# Patient Record
Sex: Female | Born: 1955 | Race: Black or African American | Hispanic: No | Smoking: Never smoker
Health system: Southern US, Community
[De-identification: ages and names within clinical notes are randomized; demographics above are authoritative.]

## PROBLEM LIST (undated history)

## (undated) DIAGNOSIS — E559 Vitamin D deficiency, unspecified: Secondary | ICD-10-CM

## (undated) DIAGNOSIS — I10 Essential (primary) hypertension: Secondary | ICD-10-CM

## (undated) DIAGNOSIS — I639 Cerebral infarction, unspecified: Secondary | ICD-10-CM

## (undated) DIAGNOSIS — E7800 Pure hypercholesterolemia, unspecified: Secondary | ICD-10-CM

## (undated) DIAGNOSIS — R569 Unspecified convulsions: Secondary | ICD-10-CM

---

## 2018-07-22 DIAGNOSIS — Z8673 Personal history of transient ischemic attack (TIA), and cerebral infarction without residual deficits: Secondary | ICD-10-CM | POA: Insufficient documentation

## 2018-07-22 DIAGNOSIS — I10 Essential (primary) hypertension: Secondary | ICD-10-CM | POA: Insufficient documentation

## 2018-07-22 DIAGNOSIS — E7849 Other hyperlipidemia: Secondary | ICD-10-CM | POA: Insufficient documentation

## 2018-07-22 DIAGNOSIS — F329 Major depressive disorder, single episode, unspecified: Secondary | ICD-10-CM | POA: Insufficient documentation

## 2019-04-20 ENCOUNTER — Emergency Department (HOSPITAL_BASED_OUTPATIENT_CLINIC_OR_DEPARTMENT_OTHER): Payer: No Typology Code available for payment source

## 2019-04-20 ENCOUNTER — Emergency Department (HOSPITAL_BASED_OUTPATIENT_CLINIC_OR_DEPARTMENT_OTHER)
Admission: EM | Admit: 2019-04-20 | Discharge: 2019-04-20 | Disposition: A | Discharge status: Home / Self Care | Payer: No Typology Code available for payment source | Attending: Emergency Medicine | Admitting: Emergency Medicine

## 2019-04-20 ENCOUNTER — Other Ambulatory Visit: Payer: Self-pay

## 2019-04-20 ENCOUNTER — Encounter (HOSPITAL_BASED_OUTPATIENT_CLINIC_OR_DEPARTMENT_OTHER): Payer: Self-pay | Admitting: Emergency Medicine

## 2019-04-20 DIAGNOSIS — I10 Essential (primary) hypertension: Secondary | ICD-10-CM | POA: Insufficient documentation

## 2019-04-20 DIAGNOSIS — Z20822 Contact with and (suspected) exposure to covid-19: Secondary | ICD-10-CM | POA: Diagnosis not present

## 2019-04-20 DIAGNOSIS — R569 Unspecified convulsions: Secondary | ICD-10-CM | POA: Insufficient documentation

## 2019-04-20 HISTORY — DX: Essential (primary) hypertension: I10

## 2019-04-20 HISTORY — DX: Pure hypercholesterolemia, unspecified: E78.00

## 2019-04-20 HISTORY — DX: Unspecified convulsions: R56.9

## 2019-04-20 HISTORY — DX: Vitamin D deficiency, unspecified: E55.9

## 2019-04-20 HISTORY — DX: Cerebral infarction, unspecified: I63.9

## 2019-04-20 LAB — URINALYSIS, ROUTINE W REFLEX MICROSCOPIC
Bilirubin Urine: NEGATIVE
Glucose, UA: NEGATIVE mg/dL
Hgb urine dipstick: NEGATIVE
Ketones, ur: NEGATIVE mg/dL
Leukocytes,Ua: NEGATIVE
Nitrite: NEGATIVE
Protein, ur: NEGATIVE mg/dL
Specific Gravity, Urine: 1.02 (ref 1.005–1.030)
pH: 7.5 (ref 5.0–8.0)

## 2019-04-20 LAB — CBC WITH DIFFERENTIAL/PLATELET
Abs Immature Granulocytes: 0.01 10*3/uL (ref 0.00–0.07)
Basophils Absolute: 0 10*3/uL (ref 0.0–0.1)
Basophils Relative: 0 %
Eosinophils Absolute: 0 10*3/uL (ref 0.0–0.5)
Eosinophils Relative: 0 %
HCT: 38.4 % (ref 36.0–46.0)
Hemoglobin: 12.6 g/dL (ref 12.0–15.0)
Immature Granulocytes: 0 %
Lymphocytes Relative: 15 %
Lymphs Abs: 0.7 10*3/uL (ref 0.7–4.0)
MCH: 30.1 pg (ref 26.0–34.0)
MCHC: 32.8 g/dL (ref 30.0–36.0)
MCV: 91.6 fL (ref 80.0–100.0)
Monocytes Absolute: 0.4 10*3/uL (ref 0.1–1.0)
Monocytes Relative: 9 %
Neutro Abs: 3.6 10*3/uL (ref 1.7–7.7)
Neutrophils Relative %: 76 %
Platelets: 292 10*3/uL (ref 150–400)
RBC: 4.19 MIL/uL (ref 3.87–5.11)
RDW: 13.6 % (ref 11.5–15.5)
WBC: 4.8 10*3/uL (ref 4.0–10.5)
nRBC: 0 % (ref 0.0–0.2)

## 2019-04-20 LAB — COMPREHENSIVE METABOLIC PANEL
ALT: 27 U/L (ref 0–44)
AST: 23 U/L (ref 15–41)
Albumin: 4.2 g/dL (ref 3.5–5.0)
Alkaline Phosphatase: 118 U/L (ref 38–126)
Anion gap: 7 (ref 5–15)
BUN: 14 mg/dL (ref 8–23)
CO2: 29 mmol/L (ref 22–32)
Calcium: 9.7 mg/dL (ref 8.9–10.3)
Chloride: 99 mmol/L (ref 98–111)
Creatinine, Ser: 0.54 mg/dL (ref 0.44–1.00)
GFR calc Af Amer: >60 mL/min (ref >60)
GFR calc non Af Amer: >60 mL/min (ref >60)
Glucose, Bld: 85 mg/dL (ref 70–99)
Potassium: 3.7 mmol/L (ref 3.5–5.1)
Sodium: 135 mmol/L (ref 135–145)
Total Bilirubin: 0.5 mg/dL (ref 0.3–1.2)
Total Protein: 8.6 g/dL — ABNORMAL HIGH (ref 6.5–8.1)

## 2019-04-20 LAB — SARS CORONAVIRUS 2 (TAT 6-24 HRS): SARS Coronavirus 2: NEGATIVE

## 2019-04-20 LAB — PHENYTOIN LEVEL, TOTAL: Phenytoin Lvl: <2.5 ug/mL — ABNORMAL LOW (ref 10.0–20.0)

## 2019-04-20 LAB — LACTIC ACID, PLASMA: Lactic Acid, Venous: 1.2 mmol/L (ref 0.5–1.9)

## 2019-04-20 MED ORDER — PHENYTOIN SODIUM EXTENDED 100 MG PO CAPS
300.0000 mg | ORAL_CAPSULE | Freq: Once | ORAL | Status: AC
  Administered 2019-04-20: 300 mg via ORAL
  Filled 2019-04-20: qty 3

## 2019-04-20 NOTE — ED Provider Notes (Signed)
[facility] EMERGENCY DEPARTMENT Provider Note   CSN: [id number] Arrival date & time: 04/20/19  1055     History Chief Complaint  Patient presents with  . Seizures    Casey Edwards is a 64 y.o. female.  HPI   64 year old female with possible seizure.  History is from her sister.  Patient has a history of a stroke and aphasia.  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  She has been on different agents over the years.  Currently she is on Keppra and Dilantin.  The Dilantin was recently decreased in dosage a few weeks ago.  This morning the patient had an episode which per she was just staring blankly and not responding.  No oral trauma.  No shaking.  No incontinence.  Symptoms lasted a few minutes and then resolved.  Happened again shortly later so she brought her in for evaluation.  Otherwise seems to be acting like her normal self.  No reported fevers.  No vomiting or diarrhea.  Patient has not seem to be in any distress otherwise.  Past Medical History:  Diagnosis Date  . Hypercholesteremia   . Hypertension   . Seizures ([place])   . Stroke ([place])   . Vitamin D deficiency     There are no problems to display for this patient.   History reviewed. No pertinent surgical history.   OB History   No obstetric history on file.     History reviewed. No pertinent family history.  Social History   Tobacco Use  . Smoking status: Never Smoker  . Smokeless tobacco: Never Used  Substance Use Topics  . Alcohol use: Never  . Drug use: Never    Home Medications Prior to Admission medications   Not on File    Allergies    Patient has no known allergies.  Review of Systems   Review of Systems All systems reviewed and negative, other than as noted in HPI.  Physical Exam Updated Vital Signs BP 140/84   Pulse (!) 102   Temp 100.1 F (37.8 C) (Oral)   Resp (!) 22   Ht 5\' 3"  (1.6 m)   Wt 60.8 kg   SpO2 98%   BMI 23.74 kg/m   Physical  Exam Vitals and nursing note reviewed.  Constitutional:      General: She is not in acute distress.    Appearance: She is well-developed.  HENT:     Head: Normocephalic and atraumatic.  Eyes:     General:        Right eye: No discharge.        Left eye: No discharge.     Conjunctiva/sclera: Conjunctivae normal.  Cardiovascular:     Rate and Rhythm: Normal rate and regular rhythm.     Heart sounds: Normal heart sounds. No murmur. No friction rub. No gallop.   Pulmonary:     Effort: Pulmonary effort is normal. No respiratory distress.     Breath sounds: Normal breath sounds.  Abdominal:     General: There is no distension.     Palpations: Abdomen is soft.     Tenderness: There is no abdominal tenderness.  Musculoskeletal:        General: No tenderness.     Cervical back: Neck supple.  Skin:    General: Skin is warm and dry.  Neurological:     Mental Status: She is alert.     Comments: Laying in bed awake.  Will follow commands.  Will nod head yes/no and answer simple questions.  Psychiatric:        Behavior: Behavior normal.        Thought Content: Thought content normal.     ED Results / Procedures / Treatments   Labs (all labs ordered are listed, but only abnormal results are displayed) Labs Reviewed  URINE CULTURE - Abnormal; Notable for the following components:      Result Value   Culture MULTIPLE SPECIES PRESENT, SUGGEST RECOLLECTION (*)    All other components within normal limits  COMPREHENSIVE METABOLIC PANEL - Abnormal; Notable for the following components:   Total Protein 8.6 (*)    All other components within normal limits  URINALYSIS, ROUTINE W REFLEX MICROSCOPIC - Abnormal; Notable for the following components:   APPearance CLOUDY (*)    All other components within normal limits  PHENYTOIN LEVEL, TOTAL - Abnormal; Notable for the following components:   Phenytoin Lvl <2.5 (*)    All other components within normal limits  SARS CORONAVIRUS 2 (TAT 6-24  HRS)  CBC WITH DIFFERENTIAL/PLATELET  LACTIC ACID, PLASMA    EKG None  Radiology DG Chest Portable 1 View  Result Date: 04/20/2019 CLINICAL DATA:  Fever. EXAM: PORTABLE CHEST 1 VIEW COMPARISON:  None. FINDINGS: Patient rotated minimally left. Midline trachea. Normal heart size. Atherosclerosis in the transverse aorta. No pleural effusion or pneumothorax. Low lung volumes. Clear lungs. IMPRESSION: Low lung volumes, without acute disease. Aortic Atherosclerosis (ICD10-I70.0). Electronically Signed   By: Jeronimo Greaves M.D.   On: 04/20/2019 12:00    Procedures Procedures (including critical care time)  Medications Ordered in ED Medications - No data to display  ED Course  I have reviewed the triage vital signs and the nursing notes.  Pertinent labs & imaging results that were available during my care of the patient were reviewed by me and considered in my medical decision making (see chart for details).    MDM Rules/Calculators/A&P                     64 year old female with seizure-like activity.  Now back to baseline.  Dilantin level is low.  Loaded.  We will have her increase dose to 200 mg every other day with 100 mg on the off day.  Discussed with her family who is in charge of her medications.  They are comfortable with this dosing.  Outpatient follow-up with her typical providers otherwise. Final Clinical Impression(s) / ED Diagnoses Final diagnoses:  Seizure-like activity [facility])    Rx / DC Orders ED Discharge Orders    None       Raeford Razor, MD 04/23/19 1125

## 2019-04-20 NOTE — ED Notes (Signed)
Assisted pt to use bedpan to void; sample unable to be used d/t decontamination.

## 2019-04-20 NOTE — ED Triage Notes (Signed)
Pt having some seizure like symptoms around the beginning of Feb.  Shaking, not talking well.  [facility], found that her phenobarb level was too high.  They decreased dose to one daily.  Today mother went to wake her up and she did not respond to her, seemed to be staring.  Did not seem like herself.  Continues to have staring episodes, mother believes they may be seizures.

## 2019-04-20 NOTE — Discharge Instructions (Addendum)
Alternate days taking: 1) 1 capsule 100mg  of dilantin and 2) 2 capsules of 100mg  dilantin  Follow-up with her doctor at the .

## 2019-04-21 LAB — URINE CULTURE

## 2020-08-24 ENCOUNTER — Other Ambulatory Visit: Payer: Self-pay

## 2020-08-24 ENCOUNTER — Emergency Department (HOSPITAL_COMMUNITY): Payer: No Typology Code available for payment source

## 2020-08-24 ENCOUNTER — Inpatient Hospital Stay (HOSPITAL_COMMUNITY)
Admission: EM | Admit: 2020-08-24 | Discharge: 2020-08-30 | DRG: 101 | Disposition: A | Discharge status: Skilled Nursing Facility | Payer: No Typology Code available for payment source | Source: Skilled Nursing Facility | Attending: Student in an Organized Health Care Education/Training Program | Admitting: Student in an Organized Health Care Education/Training Program

## 2020-08-24 ENCOUNTER — Observation Stay (HOSPITAL_COMMUNITY): Payer: No Typology Code available for payment source

## 2020-08-24 DIAGNOSIS — Z7902 Long term (current) use of antithrombotics/antiplatelets: Secondary | ICD-10-CM

## 2020-08-24 DIAGNOSIS — N390 Urinary tract infection, site not specified: Secondary | ICD-10-CM | POA: Diagnosis not present

## 2020-08-24 DIAGNOSIS — G40909 Epilepsy, unspecified, not intractable, without status epilepticus: Secondary | ICD-10-CM | POA: Diagnosis not present

## 2020-08-24 DIAGNOSIS — M170 Bilateral primary osteoarthritis of knee: Secondary | ICD-10-CM | POA: Diagnosis present

## 2020-08-24 DIAGNOSIS — R569 Unspecified convulsions: Secondary | ICD-10-CM

## 2020-08-24 DIAGNOSIS — F0150 Vascular dementia without behavioral disturbance: Secondary | ICD-10-CM | POA: Diagnosis present

## 2020-08-24 DIAGNOSIS — G9340 Encephalopathy, unspecified: Secondary | ICD-10-CM | POA: Diagnosis present

## 2020-08-24 DIAGNOSIS — F32A Depression, unspecified: Secondary | ICD-10-CM | POA: Diagnosis present

## 2020-08-24 DIAGNOSIS — F0390 Unspecified dementia without behavioral disturbance: Secondary | ICD-10-CM | POA: Diagnosis present

## 2020-08-24 DIAGNOSIS — R4182 Altered mental status, unspecified: Secondary | ICD-10-CM | POA: Diagnosis not present

## 2020-08-24 DIAGNOSIS — I10 Essential (primary) hypertension: Secondary | ICD-10-CM | POA: Diagnosis present

## 2020-08-24 DIAGNOSIS — E7800 Pure hypercholesterolemia, unspecified: Secondary | ICD-10-CM | POA: Diagnosis present

## 2020-08-24 DIAGNOSIS — G8191 Hemiplegia, unspecified affecting right dominant side: Secondary | ICD-10-CM

## 2020-08-24 DIAGNOSIS — Z79899 Other long term (current) drug therapy: Secondary | ICD-10-CM

## 2020-08-24 DIAGNOSIS — E559 Vitamin D deficiency, unspecified: Secondary | ICD-10-CM | POA: Diagnosis present

## 2020-08-24 DIAGNOSIS — Z20822 Contact with and (suspected) exposure to covid-19: Secondary | ICD-10-CM | POA: Diagnosis present

## 2020-08-24 DIAGNOSIS — I69320 Aphasia following cerebral infarction: Secondary | ICD-10-CM

## 2020-08-24 DIAGNOSIS — I639 Cerebral infarction, unspecified: Secondary | ICD-10-CM | POA: Diagnosis present

## 2020-08-24 DIAGNOSIS — R339 Retention of urine, unspecified: Secondary | ICD-10-CM

## 2020-08-24 DIAGNOSIS — I69351 Hemiplegia and hemiparesis following cerebral infarction affecting right dominant side: Secondary | ICD-10-CM

## 2020-08-24 LAB — DIFFERENTIAL
Abs Immature Granulocytes: 0.06 10*3/uL (ref 0.00–0.07)
Basophils Absolute: 0 10*3/uL (ref 0.0–0.1)
Basophils Relative: 0 %
Eosinophils Absolute: 0 10*3/uL (ref 0.0–0.5)
Eosinophils Relative: 0 %
Immature Granulocytes: 1 %
Lymphocytes Relative: 9 %
Lymphs Abs: 1.2 10*3/uL (ref 0.7–4.0)
Monocytes Absolute: 1.2 10*3/uL — ABNORMAL HIGH (ref 0.1–1.0)
Monocytes Relative: 9 %
Neutro Abs: 10.8 10*3/uL — ABNORMAL HIGH (ref 1.7–7.7)
Neutrophils Relative %: 81 %

## 2020-08-24 LAB — POCT I-STAT, CHEM 8
BUN: 14 mg/dL (ref 8–23)
Calcium, Ion: 1.07 mmol/L — ABNORMAL LOW (ref 1.15–1.40)
Chloride: 105 mmol/L (ref 98–111)
Creatinine, Ser: 0.3 mg/dL — ABNORMAL LOW (ref 0.44–1.00)
Glucose, Bld: 109 mg/dL — ABNORMAL HIGH (ref 70–99)
HCT: 44 % (ref 36.0–46.0)
Hemoglobin: 15 g/dL (ref 12.0–15.0)
Potassium: 4.1 mmol/L (ref 3.5–5.1)
Sodium: 138 mmol/L (ref 135–145)
TCO2: 24 mmol/L (ref 22–32)

## 2020-08-24 LAB — RESP PANEL BY RT-PCR (FLU A&B, COVID) ARPGX2
Influenza A by PCR: NEGATIVE
Influenza B by PCR: NEGATIVE
SARS Coronavirus 2 by RT PCR: NEGATIVE

## 2020-08-24 LAB — COMPREHENSIVE METABOLIC PANEL
ALT: 38 U/L (ref 0–44)
AST: 47 U/L — ABNORMAL HIGH (ref 15–41)
Albumin: 3.9 g/dL (ref 3.5–5.0)
Alkaline Phosphatase: 106 U/L (ref 38–126)
Anion gap: 13 (ref 5–15)
BUN: 13 mg/dL (ref 8–23)
CO2: 23 mmol/L (ref 22–32)
Calcium: 9.9 mg/dL (ref 8.9–10.3)
Chloride: 103 mmol/L (ref 98–111)
Creatinine, Ser: 0.54 mg/dL (ref 0.44–1.00)
GFR, Estimated: >60 mL/min (ref >60)
Glucose, Bld: 105 mg/dL — ABNORMAL HIGH (ref 70–99)
Potassium: 4.5 mmol/L (ref 3.5–5.1)
Sodium: 139 mmol/L (ref 135–145)
Total Bilirubin: 0.4 mg/dL (ref 0.3–1.2)
Total Protein: 7.8 g/dL (ref 6.5–8.1)

## 2020-08-24 LAB — CBC
HCT: 39.6 % (ref 36.0–46.0)
Hemoglobin: 13.1 g/dL (ref 12.0–15.0)
MCH: 29.4 pg (ref 26.0–34.0)
MCHC: 33.1 g/dL (ref 30.0–36.0)
MCV: 89 fL (ref 80.0–100.0)
Platelets: 275 10*3/uL (ref 150–400)
RBC: 4.45 MIL/uL (ref 3.87–5.11)
RDW: 14.6 % (ref 11.5–15.5)
WBC: 13.3 10*3/uL — ABNORMAL HIGH (ref 4.0–10.5)
nRBC: 0 % (ref 0.0–0.2)

## 2020-08-24 LAB — PROTIME-INR
INR: 1.1 (ref 0.8–1.2)
Prothrombin Time: 14.3 seconds (ref 11.4–15.2)

## 2020-08-24 LAB — APTT: aPTT: 22 seconds — ABNORMAL LOW (ref 24–36)

## 2020-08-24 LAB — CBG MONITORING, ED: Glucose-Capillary: 102 mg/dL — ABNORMAL HIGH (ref 70–99)

## 2020-08-24 LAB — ETHANOL: Alcohol, Ethyl (B): <10 mg/dL (ref <10)

## 2020-08-24 MED ORDER — POLYETHYLENE GLYCOL 3350 17 G PO PACK: 17.0000 g | PACK | Freq: Every day | ORAL | Status: DC | PRN

## 2020-08-24 MED ORDER — LORAZEPAM 2 MG/ML IJ SOLN: 1.0000 mg | Freq: Once | INTRAMUSCULAR | Status: DC | PRN

## 2020-08-24 MED ORDER — CLOPIDOGREL BISULFATE 75 MG PO TABS
75.0000 mg | ORAL_TABLET | Freq: Every day | ORAL | Status: DC
  Administered 2020-08-25 – 2020-08-30 (×6): 75 mg via ORAL
  Filled 2020-08-24 (×7): qty 1

## 2020-08-24 MED ORDER — LEVETIRACETAM IN NACL 1500 MG/100ML IV SOLN
1500.0000 mg | Freq: Two times a day (BID) | INTRAVENOUS | Status: DC
  Administered 2020-08-24 – 2020-08-25 (×3): 1500 mg via INTRAVENOUS
  Filled 2020-08-24 (×3): qty 100

## 2020-08-24 MED ORDER — SODIUM CHLORIDE 0.9 % IV SOLN
130.0000 mg | Freq: Two times a day (BID) | INTRAVENOUS | Status: DC
  Administered 2020-08-24 – 2020-08-25 (×2): 130 mg via INTRAVENOUS
  Filled 2020-08-24 (×4): qty 2.6

## 2020-08-24 MED ORDER — LORAZEPAM 2 MG/ML IJ SOLN: 1.0000 mg | INTRAMUSCULAR | Status: DC | PRN

## 2020-08-24 MED ORDER — LEVETIRACETAM IN NACL 1000 MG/100ML IV SOLN: 1000.0000 mg | Freq: Two times a day (BID) | INTRAVENOUS | Status: DC

## 2020-08-24 MED ORDER — LORAZEPAM 2 MG/ML IJ SOLN
2.0000 mg | Freq: Once | INTRAMUSCULAR | Status: AC
  Administered 2020-08-24: 2 mg via INTRAVENOUS
  Filled 2020-08-24: qty 1

## 2020-08-24 MED ORDER — SODIUM CHLORIDE 0.9 % IV SOLN
75.0000 mL/h | INTRAVENOUS | Status: DC
  Administered 2020-08-24: 75 mL/h via INTRAVENOUS

## 2020-08-24 MED ORDER — SERTRALINE HCL 50 MG PO TABS
50.0000 mg | ORAL_TABLET | Freq: Every day | ORAL | Status: DC
  Filled 2020-08-24: qty 1

## 2020-08-24 MED ORDER — SIMVASTATIN 20 MG PO TABS
20.0000 mg | ORAL_TABLET | Freq: Every day | ORAL | Status: DC
  Administered 2020-08-24: 20 mg via ORAL
  Filled 2020-08-24: qty 1

## 2020-08-24 MED ORDER — ENOXAPARIN SODIUM 40 MG/0.4ML IJ SOSY
40.0000 mg | PREFILLED_SYRINGE | INTRAMUSCULAR | Status: DC
  Administered 2020-08-24 – 2020-08-30 (×7): 40 mg via SUBCUTANEOUS
  Filled 2020-08-24 (×7): qty 0.4

## 2020-08-24 NOTE — Progress Notes (Signed)
STAT EEG completed; results pending. Dr Yadav notified. 

## 2020-08-24 NOTE — Progress Notes (Signed)
Phenytoin Initial Consult Indication: Seizures (continuation of home regimen)  No Known Allergies  Patient Measurements: Height: 5\' 3"  (160 cm) Weight: 60.8 kg (134 lb 0.6 oz) IBW/kg (Calculated) : 52.4 TPN AdjBW (KG): 60.8 Body mass index is 23.74 kg/m.   Vital signs: Temp: 98.6 F (37 C) (06/23 0859) Temp Source: Temporal (06/23 0859) BP: 135/84 (06/23 1500) Pulse Rate: 80 (06/23 1500)  Labs: Lab Results  Component Value Date/Time   Albumin 3.9 08/24/2020 0840   Lab Results  Component Value Date   PHENYTOIN <2.5 (L) 04/20/2019   Estimated Creatinine Clearance: 58 mL/min (A) (by C-G formula based on SCr of 0.3 mg/dL (L)).   Medications:  (Not in a hospital admission)   Assessment: 34 YOF who presented with altered mental status. MD felt the presentation to be most consistent with seizure activity and patient was loaded with IV Keppra and started on LTM EEG. Pharmacy consulted to resume home dilantin therapy.   Discussed case with neurology who agrees that it is okay to resume home dose as IV while awaiting phenytoin level and adjusting accordingly   Corrected phenytoin level (if needed): pending  Seizure activity: F/u EEG  Significant potential drug interactions: Dilantin can decrease simastatin levels but patient is on home doses   Goals of care:  Total phenytoin level: 10-20 mcg/ml Free phenytoin level: 1-2 mcg/ml  Plan:  -Resume home dose of phenytoin 130 mg (~4.3 mg/kg/day) twice daily IV  -F/u phenytoin levels  -Anticipated maintenance dose: -Pharmacy will continue to follow regarding obtaining total phenytoin levels and dose adjustments as indicated.   75, PharmD., BCPS, BCCCP Clinical Pharmacist Please refer to [facility] for unit-specific pharmacist

## 2020-08-24 NOTE — Progress Notes (Signed)
LTM started, using MRI compatible leads, no initial skin breakdown. Event button tested.

## 2020-08-24 NOTE — ED Provider Notes (Signed)
[facility] EMERGENCY DEPARTMENT Provider Note   CSN: [id number] Arrival date & time: 08/24/20  1660  An emergency department physician performed an initial assessment on this suspected stroke patient at [phone].  History Chief Complaint  Patient presents with   Code Stroke    Casey Edwards is a 65 y.o. female.  HPI     65 year old female with a history of hypertension, hyperlipidemia, CVA with aphasia and right-sided weakness, seizures labs, presents as a code stroke with EMS after she had developed a right-sided gaze preference and was no longer able to follow commands or communicate per her baseline.  LNW was around 750AM at [facility].  History is limited as patient with altered mental status and also has baseline aphasia. She presents with lip smacking and gaze deviation to the right.  Possible left sided weakness however noted by neurology to be moving the left side at times.  Following commands.    Past Medical History:  Diagnosis Date   Hypercholesteremia    Hypertension    Seizures (HCC)    Stroke [facility])    Vitamin D deficiency     Patient Active Problem List   Diagnosis Date Noted   CVA (cerebral vascular accident) (HCC) 08/24/2020   Seizure (HCC) 08/24/2020    No past surgical history on file.   OB History   No obstetric history on file.     No family history on file.  Social History   Tobacco Use   Smoking status: Never   Smokeless tobacco: Never  Substance Use Topics   Alcohol use: Never   Drug use: Never    Home Medications Prior to Admission medications   Medication Sig Start Date End Date Taking? Authorizing Provider  acetaminophen (TYLENOL) 500 MG tablet Take 1,000 mg by mouth daily.   Yes [provider]  amLODipine (NORVASC) 5 MG tablet Take 5 mg by mouth daily.   Yes [provider]  baclofen (LIORESAL) 10 MG tablet Take 5 mg by mouth 3 (three) times daily.   Yes [provider]   Cholecalciferol (VITAMIN D3) 50 MCG (2000 UT) TABS Take 2,000 Units by mouth daily.   Yes [provider]  clopidogrel (PLAVIX) 75 MG tablet Take 75 mg by mouth daily.   Yes [provider]  Ensure Plus (ENSURE PLUS) LIQD Take 237 mLs by mouth 2 (two) times daily. Strawberry flavor   Yes [provider]  ferrous sulfate 325 (65 FE) MG tablet Take 325 mg by mouth daily.   Yes [provider]  levETIRAcetam (KEPPRA) 250 MG tablet Take 125 mg by mouth 2 (two) times daily.   Yes [provider]  Multiple Vitamins-Minerals (MULTIVITAMIN WITH MINERALS) tablet Take 1 tablet by mouth daily.   Yes [provider]  Omega-3 Fatty Acids (FISH OIL) 1000 MG CAPS Take 2,000 mg by mouth 2 (two) times daily.   Yes [provider]  phenytoin (DILANTIN) 100 MG ER capsule Take 100 mg by mouth 2 (two) times daily. Take with 30mg  capsule for a total of 130mg  two times daily. 04/29/20  Yes [provider]  phenytoin (DILANTIN) 30 MG ER capsule Take 30 mg by mouth 2 (two) times daily. Take with 100mg  capsule for a total of 130mg  two times daily.   Yes [provider]  sertraline (ZOLOFT) 100 MG tablet Take 50 mg by mouth daily.   Yes [provider]  simvastatin (ZOCOR) 20 MG tablet Take 20 mg by mouth  at bedtime.   Yes [provider]    Allergies    Patient has no known allergies.  Review of Systems   Review of Systems  Unable to perform ROS: Mental status change  Constitutional:  Negative for fever.  Neurological:  Positive for speech difficulty and weakness.   Physical Exam Updated Vital Signs BP 126/73   Pulse 82   Temp 98.6 F (37 C) (Temporal)   Resp 18   Ht 5\' 3"  (1.6 m)   Wt 60.8 kg   SpO2 100%   BMI 23.74 kg/m   Physical Exam Vitals and nursing note reviewed.  Constitutional:      General: She is not in acute distress.    Appearance: Normal appearance. She is not ill-appearing, toxic-appearing  or diaphoretic.  HENT:     Head: Normocephalic and atraumatic.  Eyes:     General: No visual field deficit.    Extraocular Movements: Extraocular movements intact.     Conjunctiva/sclera: Conjunctivae normal.     Pupils: Pupils are equal, round, and reactive to light.  Cardiovascular:     Rate and Rhythm: Normal rate and regular rhythm.     Pulses: Normal pulses.  Pulmonary:     Effort: Pulmonary effort is normal. No respiratory distress.  Musculoskeletal:        General: No swelling, tenderness, deformity or signs of injury.     Cervical back: Normal range of motion. No rigidity.  Skin:    General: Skin is warm and dry.     Coloration: Skin is not jaundiced or pale.     Findings: No erythema or rash.  Neurological:     General: No focal deficit present.     Mental Status: She is alert and oriented to person, place, and time.     GCS: GCS eye subscore is 4. GCS verbal subscore is 5. GCS motor subscore is 6.     Cranial Nerves: No cranial nerve deficit (eyes deviated to right), dysarthria or facial asymmetry.     Sensory: Sensory deficit: difficult to assess.     Motor: Weakness (weakness noted RLE and RUE, normal lifting of left side with some weakness, left leg drops when lifted) present. No tremor.     Coordination: Finger-Nose-Finger Test normal.     Gait: Gait normal.     Comments: Lip smacking    ED Results / Procedures / Treatments   Labs (all labs ordered are listed, but only abnormal results are displayed) Labs Reviewed  APTT - Abnormal; Notable for the following components:      Result Value   aPTT 22 (*)    All other components within normal limits  CBC - Abnormal; Notable for the following components:   WBC 13.3 (*)    All other components within normal limits  DIFFERENTIAL - Abnormal; Notable for the following components:   Neutro Abs 10.8 (*)    Monocytes Absolute 1.2 (*)    All other components within normal limits  COMPREHENSIVE METABOLIC PANEL -  Abnormal; Notable for the following components:   Glucose, Bld 105 (*)    AST 47 (*)    All other components within normal limits  CBG MONITORING, ED - Abnormal; Notable for the following components:   Glucose-Capillary 102 (*)    All other components within normal limits  POCT I-STAT, CHEM 8 - Abnormal; Notable for the following components:   Creatinine, Ser 0.30 (*)    Glucose, Bld 109 (*)    Calcium,  Ion 1.07 (*)    All other components within normal limits  RESP PANEL BY RT-PCR (FLU A&B, COVID) ARPGX2  ETHANOL  PROTIME-INR  RAPID URINE DRUG SCREEN, HOSP PERFORMED  URINALYSIS, ROUTINE W REFLEX MICROSCOPIC  PHENYTOIN LEVEL, FREE AND TOTAL  HIV ANTIBODY (ROUTINE TESTING W REFLEX)  HEMOGLOBIN A1C  LIPID PANEL  CBC  BASIC METABOLIC PANEL  I-STAT CHEM 8, ED    EKG EKG Interpretation  Date/Time:  Thursday August 24 2020 08:53:52 EDT Ventricular Rate:  85 PR Interval:  171 QRS Duration: 83 QT Interval:  384 QTC Calculation: 457 R Axis:   -70 Text Interpretation: Sinus rhythm Left anterior fascicular block RSR' in V1 or V2, right VCD or RVH No significant change since last tracing Confirmed by Alvira MondaySchlossman, Yezenia Fredrick ([id number]) on 08/24/2020 12:06:20 PM  Radiology MR BRAIN WO CONTRAST  Result Date: 08/24/2020 CLINICAL DATA:  Code stroke follow-up EXAM: MRI HEAD WITHOUT CONTRAST TECHNIQUE: Multiplanar, multiecho pulse sequences of the brain and surrounding structures were obtained without intravenous contrast. COMPARISON:  Prior MRI from 2014 is unavailable at time of dictation. FINDINGS: Significant motion artifact is present. Below findings are within this limitation. Brain: There is no acute infarction. Foci of susceptibility hypointensity in the left insular region and right putamen may reflect chronic microhemorrhage or mineralization. There are chronic infarcts of the left temporal, parietal, and occipital lobes. Chronic infarct of the left basal ganglia and adjacent white matter. There  is marked volume loss in the left cerebral hemisphere with ex vacuo dilatation of the left lateral ventricle. Associated wallerian degeneration along the left cerebral peduncle and contralateral right cerebellar volume loss noted. There is no intracranial mass or mass effect. No extra-axial collection. Vascular: Major vessel flow voids at the skull base are preserved. Skull and upper cervical spine: Normal marrow signal is preserved. Sinuses/Orbits: No apparent abnormality. Other: Patchy right mastoid fluid opacification. IMPRESSION: Significantly motion degraded. No acute infarction. Multiple chronic infarcts. Electronically Signed   By: Guadlupe SpanishPraneil  Patel M.D.   On: 08/24/2020 14:50   EEG adult  Result Date: 08/24/2020 Charlsie QuestYadav, Priyanka O, MD     08/24/2020 11:28 AM Patient Name: Casey Edwards MRN: [id number] Epilepsy Attending: Charlsie QuestPriyanka O Yadav Referring Physician/Provider: Lanae BoastStevi Toberman, NP Date: 08/24/2020 Duration: 23.19 mins Patient history: 65 year old female with history of stroke and residual right-sided weakness,aphasia found to be acutely altered with right gaze preference.  EEG to evaluate for seizures. Level of alertness: lethargic AEDs during EEG study: LEV, PHT, Ativan Technical aspects: This EEG study was done with scalp electrodes positioned according to the 10-20 International system of electrode placement. Electrical activity was acquired at a sampling rate of 500Hz  and reviewed with a high frequency filter of 70Hz  and a low frequency filter of 1Hz . EEG data were recorded continuously and digitally stored. Description: EEG showed continuous generalized low amplitude 3-5 theta-delta slowing.  Hyperventilation and photic stimulation were not performed.   Of note, study was technically difficult due to the significant myogenic artifact. ABNORMALITY - Continuous slow, generalized IMPRESSION: This technically difficult study suggestive of moderate to severe diffuse encephalopathy, nonspecific etiology.   No seizures or epileptiform discharges were seen throughout the recording. Charlsie QuestPriyanka O Yadav   CT HEAD CODE STROKE WO CONTRAST  Result Date: 08/24/2020 CLINICAL DATA:  Code stroke.  Aphasia, right gaze EXAM: CT HEAD WITHOUT CONTRAST TECHNIQUE: Contiguous axial images were obtained from the base of the skull through the vertex without intravenous contrast. COMPARISON:  Images from 2014 study are unavailable at time  of dictation FINDINGS: Brain: No acute intracranial hemorrhage, mass effect, or edema. There is no definite acute appearing loss of gray-white differentiation. Chronic infarcts of the left temporal, parietal, and occipital lobes. Chronic infarct of the left basal ganglia and adjacent white matter. Ex vacuo dilatation of the left lateral ventricle. Additional patchy and confluent areas of low-attenuation in the supratentorial white matter are nonspecific but probably reflect chronic microvascular ischemic changes. Prominence of the ventricles and sulci reflects parenchymal volume loss. There is no extra-axial collection. 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 Vascular: No hyperdense vessel. There is intracranial atherosclerotic calcification at the skull base. Skull: Unremarkable. Sinuses/Orbits: No acute abnormality. Other: Mastoid air cells are clear. IMPRESSION: No acute intracranial hemorrhage or mass effect. No definite acute infarction. Sequelae of chronic left cerebral infarcts. Chronic microvascular ischemic changes. These results were communicated to Dr. Derry Lory at 8:45 am on 08/24/2020 by text page via the [facility] messaging system. Electronically Signed   By: Guadlupe Spanish M.D.   On: 08/24/2020 08:47    Procedures Procedures   Medications Ordered in ED Medications  levETIRAcetam (KEPPRA) IVPB 1500 mg/ 100 mL premix (1,500 mg Intravenous New Bag/Given 08/24/20 2125)  simvastatin (ZOCOR) tablet 20  mg (20 mg Oral Given 08/24/20 2149)  clopidogrel (PLAVIX) tablet 75 mg (has no administration in time range)  0.9 %  sodium chloride infusion (75 mL/hr Intravenous New Bag/Given 08/24/20 1513)  enoxaparin (LOVENOX) injection 40 mg (40 mg Subcutaneous Given 08/24/20 1513)  LORazepam (ATIVAN) injection 1-2 mg (has no administration in time range)  polyethylene glycol (MIRALAX / GLYCOLAX) packet 17 g (has no administration in time range)  LORazepam (ATIVAN) injection 1 mg (has no administration in time range)  phenytoin (DILANTIN) 130 mg in sodium chloride 0.9 % 100 mL IVPB (0 mg Intravenous Stopped 08/24/20 1811)  LORazepam (ATIVAN) injection 2 mg (2 mg Intravenous Given 08/24/20 0947)    ED Course  I have reviewed the triage vital signs and the nursing notes.  Pertinent labs & imaging results that were available during my care of the patient were reviewed by me and considered in my medical decision making (see chart for details).    MDM Rules/Calculators/A&P                            65 year old female with a history of hypertension, hyperlipidemia, CVA with aphasia and right-sided weakness, seizures labs, presents as a code stroke with EMS after she had developed a right-sided gaze preference and was no longer able to follow commands or communicate per her baseline.  DDx includes seizure versus acute CVA vs other. No significant electrolyte abnormalities. CT without acute findings. Dr. Derry Lory Neurology at bedside on patient arrival, noting some movement of left side, feels presentation is most consistent with seizure and is not recommending tPA or IR. Given keppra.  Admitted for further care, MRI pending.   Final Clinical Impression(s) / ED Diagnoses Final diagnoses:  Seizures [facility])    Rx / DC Orders ED Discharge Orders     None        Alvira Monday, MD 08/24/20 2306

## 2020-08-24 NOTE — Progress Notes (Signed)
Reconnected MRI leads and helped move pt to 038 without incident.

## 2020-08-24 NOTE — H&P (Signed)
Date: 08/24/2020               Patient Name:  Casey Edwards MRN: [id number]  DOB: April 10, 1955 Age / Sex: 65 y.o., female   PCP: [facility]         Medical Service: Internal Medicine Teaching Service         Attending Physician: Dr. Oswaldo Done, Marquita Palms, *    First Contact: Dr. Austin Miles Pager: [phone]  Second Contact: Dr. Huel Cote Pager: [phone]       After Hours (After 5p/  First Contact Pager: [phone]  weekends / holidays): Second Contact Pager: [phone]   Chief Complaint: Left sided weakness  History of Present Illness:  Casey Edwards is a 65 yo F w/ PMH of Cva w/ residual aphasia, right sided weakness, seizure disorder, htn, hld presenting to [facility] with new onset weakness and altered mental status. Casey Edwards was examined and evaluated at bedside in ED. She was noted to be alert but unable to answer questions or follow directions.  Additional history was obtained in conversation with nursing staff at [facility]. She was in her usual state of health until this morning around 7:30 when she was found to be unresponsive and staring preferentially to the right. She also began to have increased respiratory effort in about 20 minutes and EMS was called.   At baseline, she endorse expressive aphasia and is mostly non-verbal although she is able to communicate her needs with hand gestures and nodding. She is able to follow instructions. Noted to have history of seizure disorder thought to be sequelae of prior stroke that presents with 'staring spells' She is confirmed to be on levetiracetam 125mg  BID and phenytoin 130mg  BID for her seizure disorder and nursing staff denies any interruption in med administration.  Attempted to call family to obtain additional history but family member did not pick up. Unable to leave voicemail.  Meds: Current Meds  Medication Sig   acetaminophen (TYLENOL) 500 MG tablet Take 1,000 mg by mouth daily.   amLODipine (NORVASC)  5 MG tablet Take 5 mg by mouth daily.   baclofen (LIORESAL) 10 MG tablet Take 5 mg by mouth 3 (three) times daily.   Cholecalciferol (VITAMIN D3) 50 MCG (2000 UT) TABS Take 2,000 Units by mouth daily.   clopidogrel (PLAVIX) 75 MG tablet Take 75 mg by mouth daily.   Ensure Plus (ENSURE PLUS) LIQD Take 237 mLs by mouth 2 (two) times daily. Strawberry flavor   ferrous sulfate 325 (65 FE) MG tablet Take 325 mg by mouth daily.   levETIRAcetam (KEPPRA) 250 MG tablet Take 125 mg by mouth 2 (two) times daily.   Multiple Vitamins-Minerals (MULTIVITAMIN WITH MINERALS) tablet Take 1 tablet by mouth daily.   Omega-3 Fatty Acids (FISH OIL) 1000 MG CAPS Take 2,000 mg by mouth 2 (two) times daily.   phenytoin (DILANTIN) 100 MG ER capsule Take 100 mg by mouth 2 (two) times daily. Take with 30mg  capsule for a total of 130mg  two times daily.   phenytoin (DILANTIN) 30 MG ER capsule Take 30 mg by mouth 2 (two) times daily. Take with 100mg  capsule for a total of 130mg  two times daily.   sertraline (ZOLOFT) 100 MG tablet Take 50 mg by mouth daily.   simvastatin (ZOCOR) 20 MG tablet Take 20 mg by mouth at bedtime.   Allergies: Allergies as of 08/24/2020   (No Known Allergies)   Past Medical History:  Diagnosis Date   Hypercholesteremia  Hypertension    Seizures (HCC)    Stroke (HCC)    Vitamin D deficiency     Family History: Unable to provide  Social History: Recently moved in to [facility] facility. Needs walker for mobility  Review of Systems: Unable to provide  Physical Exam: Blood pressure 133/77, pulse 78, temperature 98.6 F (37 C), temperature source Temporal, resp. rate 20, height 5\' 3"  (1.6 m), weight 60.8 kg, SpO2 99 %.  Gen: Well-developed, chronically ill-appearing, appear agitated HEENT: NCAT head, hearing intact, no gaze preference noted, lip smacking observed Neck: supple, ROM intact, no JVD CV: RRR, S1, S2 normal, No rubs, no murmurs, no gallops Pulm: CTAB, No rales,  no wheezes Abd: Soft, BS+, NTND, No rebound, no guarding Extm: ROM intact, Peripheral pulses intact Skin: Dry, Warm, normal turgor, no rashes, lesions, wounds.  Neuro: Alert, Aphasic, Unable to follow directions for full neuro exam but observed spontaneous movement of all extremities, left leg unable to hold against gravity  EKG: personally reviewed my interpretation is normal sinus, no ST changes, similar to prior EKG  CXR: N/A  Assessment & Plan by Problem: Principal Problem:   Seizure (HCC) Active Problems:   CVA (cerebral vascular accident) (HCC)  Casey Edwards is a 65 yo F w/ PMH of Cva w/ residual aphasia, right sided weakness, seizure disorder, htn, hld presenting to [facility] with new onset left sided weakness and altered mental status likely 2/2 post-ictal state from breakthrough seizure vs acute CVA  Acute encephalopathy w/ left sided weakness 2/2 Seizure vs Stroke Hx of CVA w/ residual aphasia, Right sided weakness Hx of Seizure disorder Present w/ code stroke due to 'staring spell' CT head negative. Spot EEG w/o seizures or epileptiform findings. Neurology on board. No missed AEDs at SNF per nursing staff. On clopidogrel for anti-platelet therapy. Possibly acute CVA vs breakthrough seizure. Received loading dose levetiracetam. Will need admission for further work-up - Appreciate neuro recs: Continous EEG - Defer anti-epileptic therapy to neuro - Ativan PRN for seizure >5 mins - F/u MRI - F/u Dilantin level - Hgb a1c, lipid profile - PT/OT/SLP - NPO - F/u Ua - C/w home clopidogrel, simvastatin   HTN Admit bp 133/77. On amlodipine 5mg  at home - Holding in setting of cva work-up  Depression On sertraline at home - Holding in setting of encephalopathy  DVT prophx: lovenox Diet: NPO til S&S Bowel: Miralax Code: Full  Prior to Admission Living Arrangement: SNF Anticipated Discharge Location: SNF Barriers to Discharge: Medical work-up  Dispo: Admit patient to  Observation with expected length of stay less than 2 midnights.  Signed: [facility], MD 08/24/2020, 1:27 PM Pager: [phone] After 5pm on weekdays and 1pm on weekends: On Call Pager: [phone]

## 2020-08-24 NOTE — ED Triage Notes (Signed)
Pt BIB [facility] from [facility]. Pt with a last known normal of 0750. Staff became concerned 20 minutes after patient woke up at 0730 because patient is usually able to communicate with staff basic needs, but staff noted she was unable to do this, also was exhibiting left sided weakness and R sided gaze that is new. EMS activated a code stroke based on these findings. Pt does have a hx of stroke in 2020 which left the patient aphasic, with R sided weakness. Pt also has hx of seizures as well. All VSS at this time. NAD at this time. Pt brought to CT 1 for stroke work up.

## 2020-08-24 NOTE — ED Notes (Signed)
Patient transported to MRI. Patient disconnected from EEG for MRI.

## 2020-08-24 NOTE — Consult Note (Signed)
Neurology Consultation  Reason for Consult: Right gaze, altered mental status Referring Physician: Dr. Dalene Seltzer  CC: Right gaze, altered mental status- acutely not following commands  History is obtained from: Chart Review, EMS, unable to obtain history from patient due to altered mental status  HPI: Casey Edwards is a 65 y.o. female with a medical history significant for hypertension, hyperlipidemia, seizures, and large left MCA stroke on daily clopidogrel with residual aphasia and right-sided weakness. This morning at her facility, she woke up at baseline mental and functional status at approximately 07:50 this morning. Staff later found patient with acute right-sided gaze and she was no longer able to follow commands or point to communicate per her baseline and EMS was activated. She was brought in by EMS as a Code Stroke for further neurology evaluation.   At baseline, [name] is aphasic but is able to communicate with nursing facility staff by pointing and she is able to follow commands. She takes phenytoin 130 mg twice daily and levetiracetam 125 mg twice daily for her seizures.    LKW: 07:50 tpa given?: no, CT without acute intracranial abnormalities and it was felt that patient's presentation is more consistent with breakthrough seizures than stroke IR Thrombectomy? No, presentation is felt to be consistent with seizure activity with low suspicion for LVO. Modified Rankin Scale: 3-Moderate disability-requires help but walks WITHOUT assistance  ROS: Unable to obtain due to altered mental status.   Past Medical History:  Diagnosis Date   Hypercholesteremia    Hypertension    Seizures (HCC)    Stroke (HCC)    Vitamin D deficiency    No past surgical history on file.  No family history on file. Unable to obtain from patient due to baseline aphasia  Social History:   reports that she has never smoked. She has never used smokeless tobacco. She reports that she does not  drink alcohol and does not use drugs.  Medications  Current Facility-Administered Medications:    levETIRAcetam (KEPPRA) IVPB 1500 mg/ 100 mL premix, 1,500 mg, Intravenous, Q12H, Micki Riley, MD, Last Rate: 400 mL/hr at 08/24/20 0852, 1,500 mg at 08/24/20 0852 No current outpatient medications on file. No current outpatient medications Levetiracetam 125 mg PO BID Phenytoin 130 mg PO BID Clopidogrel 75 mg PO daily Simvastatin 20 mg qhs   Exam: Current vital signs: BP 124/79   Pulse 87   Temp 98.6 F (37 C) (Temporal)   Resp (!) 23   Ht 5\' 3"  (1.6 m)   Wt 60.8 kg   SpO2 99%   BMI 23.74 kg/m  Vital signs in last 24 hours:   GENERAL: Awake, alert, with abnormal chewing movement noted at her mouth Head: Normocephalic and atraumatic without obvious abnormality. Patient with constant chewing mouth movement on arrival EENT: Normal conjunctivae, no OP obstruction.  LUNGS - Normal respiratory effort. SpO2 99% on room air CV: Regular rate on telemetry, no pedal edema ABDOMEN: Soft, non-distended Ext: warm, well perfused, without obvious deformity  NEURO:  Mental Status: Awake and alert. She is aphasic at baseline but per EMS can point and follow commands. On arrival, she does not follow commands and does not attempt to communicate with staff. Unable to assess orientation due to mental status.  Speech/Language: speech is mute with on episode of moaning noted while in CT.   Cranial Nerves:  II: PERRL 4 mm/brisk. III, IV, VI: Right gaze preference, does not cross midline to the left visual field. V: Unable to assess  facial sensation due to patient mental status. Does not blink to threat in the left visual field. Inconsistent blink to threat in the right visual field.  VII: Face appears symmetric with grimace. VIII: Unable to assess hearing due to patient condition.  IX, X: Patient does not phonate.  XI: She does not follow commands or shrug shoulders. Rightward head turn  preference, resists passive head turn leftward.  XII: Does not protrude tongue to command.  Motor: Baseline right-sided weakness due to previous stroke. Strength assessment limited due to patient not following commands. Spontaneous antigravity movement noted of bilateral upper extremity with vertical drift noted.  Left lower extremity with minimal spontaneous movement without antigravity movement but does withdraw to noxious stimuli with brief antigravity movement.  Right lower extremity does elevate briefly against gravity with immediate drift to bed.  Increased tone noted on the right, intermittent increased tone of the left upper extremity.  Bulk is normal for age.  Sensation: Does not withdraw or grimace to noxious stimuli in the left upper extremity, brief withdraw to noxious stimuli of the left lower extremity. Brisk withdraw to noxious stimuli of the right upper and lower extremities Coordination: Unable to assess, patient does not follow commands DTRs: 2+ biceps, 0 patellae Plantars: Mute on the left, downgoing on the right Gait: Deferred  NIHSS: 1a Level of Conscious.: 0 1b LOC Questions: 2 1c LOC Commands: 2 2 Best Gaze: 1 3 Visual: 2 4 Facial Palsy: 0 5a Motor Arm - left: 1 5b Motor Arm - Right: 1 6a Motor Leg - Left: 3 6b Motor Leg - Right: 2 7 Limb Ataxia: 0 8 Sensory: 2 9 Best Language: 3, baseline aphasia 10 Dysarthria: 2 11 Extinct. and Inatten.: 1 TOTAL: 22  Labs I have reviewed labs in epic and the results pertinent to this consultation are: CBC    Component Value Date/Time   WBC 13.3 (H) 08/24/2020 0840   RBC 4.45 08/24/2020 0840   HGB 15.0 08/24/2020 0841   HCT 44.0 08/24/2020 0841   PLT 275 08/24/2020 0840   MCV 89.0 08/24/2020 0840   MCH 29.4 08/24/2020 0840   MCHC 33.1 08/24/2020 0840   RDW 14.6 08/24/2020 0840   LYMPHSABS 1.2 08/24/2020 0840   MONOABS 1.2 (H) 08/24/2020 0840   EOSABS 0.0 08/24/2020 0840   BASOSABS 0.0 08/24/2020 0840   CMP      Component Value Date/Time   NA 138 08/24/2020 0841   K 4.1 08/24/2020 0841   CL 105 08/24/2020 0841   CO2 29 04/20/2019 1211   GLUCOSE 109 (H) 08/24/2020 0841   BUN 14 08/24/2020 0841   CREATININE 0.30 (L) 08/24/2020 0841   CALCIUM 9.7 04/20/2019 1211   PROT 8.6 (H) 04/20/2019 1211   ALBUMIN 4.2 04/20/2019 1211   AST 23 04/20/2019 1211   ALT 27 04/20/2019 1211   ALKPHOS 118 04/20/2019 1211   BILITOT 0.5 04/20/2019 1211   GFRNONAA >60 04/20/2019 1211   GFRAA >60 04/20/2019 1211   Lipid Panel  No results found for: CHOL, TRIG, HDL, CHOLHDL, VLDL, LDLCALC, LDLDIRECT No results found for: HGBA1C  Imaging I have reviewed the images obtained:  CT-scan of the brain 08/24/2020: No acute intracranial hemorrhage or mass effect. No definite acute infarction. Sequelae of chronic left cerebral infarcts. Chronic microvascular ischemic changes.  Assessment: 65 y.o. with PMHx of seizures, HTN, HLD, and stroke with residual right-sided weakness and aphasia found to be acutely altered with right gaze preference this morning at skilled nursing facility.  -  On arrival examination reveals patient with a right gaze preference without crossing midline, rightward head turn, and continuous chewing mouth movements in addition to left-sided weakness and sensory deficits as evidence by no withdraw to noxious stimuli of the LUE and minimal withdrawal to noxious stimuli of the LLE. - CTH obtained without acute intracranial hemorrhage or mass effect with sequelae of chronic left cerebral infarcts. - Presentation felt to be most consistent with seizure activity and patient was given a 1,500 mg Keppra load IV with initiation of LTM EEG. Will obtain infectious work up due to leukocytosis with WBC 13.3k, though possibly reactive. Patient is afebrile on arrival. Will also obtain LEV and Dilantin levels.   Impression: History of left cerebral infarcts with residual aphasia and right-sided weakness History of  seizures on Dilantin and Keppra? at home  Recommendations: - Obtain LEV and dilantin levels - Loaded with Keppra 1,500 mg IV once - Maintenance Keppra 500mg  BID - Continue home dose of Dilantin - Obtain UA, urine culture / infectious work up due to possible breakthrough seizure etiology - Inpatient seizure precautions - Routine and LTM EEG ordered - IV Ativan 2mg  PRN for any seizure lasting > 5 minutes and notify neurology   , AGAC-NP [facility] Pager: [phone] Boast  NEUROHOSPITALIST ADDENDUM Performed a face to face diagnostic evaluation.   I have reviewed the contents of history and physical exam as documented by PA/ARNP/Resident and agree with above documentation.  I have discussed and formulated the above plan as documented. Edits to the note have been made as needed.  Impression/Key exam findings/Plan: 6F with prior large L hemispheric stroke with encephalomalacia BIB EMS for acute confusion R gaze deviation and R sided stiffness and left sided weakness. The left sided weakness improved here.  Likely prolonged seizure. Stiffness resolved with Keppra 1500mg  IV once. On discussion with daughterand per the medlist sent to me by the daughter over phone, seems like she was put on 125mg  BID of Keppra by (161 team, which is a subtherapeutic dosing. She does not have any obvious end stage renal failure on labs. Continue Dilantin 130mg  BID(this is a low dose too).  Recs: - increase Keppra to 500mg  BID - Continue Dilatin 130mg  BID for now. - LTM - recommend evaluation for infectious/metabolic abnormalities. - Seizure precautions and Ativan for seizure > ) [phone].  This patient is critically ill and at significant risk of neurological worsening, death and care requires constant monitoring of vital signs, hemodynamics,respiratory and cardiac monitoring, neurological assessment, discussion with family, other specialists and medical decision making of high complexity.  I spent 50 minutes of neurocritical care time  in the care of  this patient. This was time spent independent of any time provided by nurse practitioner or PA.  [facility] Pager Number 08/24/2020  6:57 PM   , MD [facility]   If 7pm to 7am, please call on call as listed on AMION.

## 2020-08-24 NOTE — Procedures (Signed)
Patient Name: Casey Edwards  MRN: [id number]  Epilepsy Attending: Charlsie Quest  Referring Physician/Provider: Lanae Boast, NP Date: 08/24/2020 Duration: 23.19 mins  Patient history: 65 year old female with history of stroke and residual right-sided weakness,aphasia found to be acutely altered with right gaze preference.  EEG to evaluate for seizures.  Level of alertness: lethargic   AEDs during EEG study: LEV, PHT, Ativan  Technical aspects: This EEG study was done with scalp electrodes positioned according to the 10-20 International system of electrode placement. Electrical activity was acquired at a sampling rate of 500Hz  and reviewed with a high frequency filter of 70Hz  and a low frequency filter of 1Hz . EEG data were recorded continuously and digitally stored.   Description: EEG showed continuous generalized low amplitude 3-5 theta-delta slowing.  Hyperventilation and photic stimulation were not performed.     Of note, study was technically difficult due to the significant myogenic artifact.  ABNORMALITY - Continuous slow, generalized  IMPRESSION: This technically difficult study suggestive of moderate to severe diffuse encephalopathy, nonspecific etiology.  No seizures or epileptiform discharges were seen throughout the recording.  Zanylah Hardie 

## 2020-08-25 DIAGNOSIS — I639 Cerebral infarction, unspecified: Secondary | ICD-10-CM | POA: Diagnosis present

## 2020-08-25 DIAGNOSIS — I69351 Hemiplegia and hemiparesis following cerebral infarction affecting right dominant side: Secondary | ICD-10-CM | POA: Diagnosis not present

## 2020-08-25 DIAGNOSIS — Z79899 Other long term (current) drug therapy: Secondary | ICD-10-CM | POA: Diagnosis not present

## 2020-08-25 DIAGNOSIS — E7800 Pure hypercholesterolemia, unspecified: Secondary | ICD-10-CM | POA: Diagnosis present

## 2020-08-25 DIAGNOSIS — G40909 Epilepsy, unspecified, not intractable, without status epilepticus: Secondary | ICD-10-CM | POA: Diagnosis present

## 2020-08-25 DIAGNOSIS — R569 Unspecified convulsions: Secondary | ICD-10-CM | POA: Diagnosis not present

## 2020-08-25 DIAGNOSIS — I69320 Aphasia following cerebral infarction: Secondary | ICD-10-CM

## 2020-08-25 DIAGNOSIS — R339 Retention of urine, unspecified: Secondary | ICD-10-CM | POA: Diagnosis not present

## 2020-08-25 DIAGNOSIS — F32A Depression, unspecified: Secondary | ICD-10-CM | POA: Diagnosis present

## 2020-08-25 DIAGNOSIS — Z20822 Contact with and (suspected) exposure to covid-19: Secondary | ICD-10-CM | POA: Diagnosis present

## 2020-08-25 DIAGNOSIS — Z7902 Long term (current) use of antithrombotics/antiplatelets: Secondary | ICD-10-CM | POA: Diagnosis not present

## 2020-08-25 DIAGNOSIS — I10 Essential (primary) hypertension: Secondary | ICD-10-CM | POA: Diagnosis present

## 2020-08-25 DIAGNOSIS — M170 Bilateral primary osteoarthritis of knee: Secondary | ICD-10-CM | POA: Diagnosis present

## 2020-08-25 DIAGNOSIS — F0390 Unspecified dementia without behavioral disturbance: Secondary | ICD-10-CM | POA: Diagnosis present

## 2020-08-25 DIAGNOSIS — N390 Urinary tract infection, site not specified: Secondary | ICD-10-CM | POA: Diagnosis not present

## 2020-08-25 DIAGNOSIS — F0150 Vascular dementia without behavioral disturbance: Secondary | ICD-10-CM | POA: Diagnosis present

## 2020-08-25 DIAGNOSIS — G8191 Hemiplegia, unspecified affecting right dominant side: Secondary | ICD-10-CM

## 2020-08-25 DIAGNOSIS — E559 Vitamin D deficiency, unspecified: Secondary | ICD-10-CM | POA: Diagnosis present

## 2020-08-25 DIAGNOSIS — R4182 Altered mental status, unspecified: Secondary | ICD-10-CM | POA: Diagnosis present

## 2020-08-25 DIAGNOSIS — D638 Anemia in other chronic diseases classified elsewhere: Secondary | ICD-10-CM | POA: Insufficient documentation

## 2020-08-25 DIAGNOSIS — G9340 Encephalopathy, unspecified: Secondary | ICD-10-CM | POA: Diagnosis not present

## 2020-08-25 LAB — PHENYTOIN LEVEL, FREE AND TOTAL
Phenytoin, Free: NOT DETECTED ug/mL (ref 1.0–2.0)
Phenytoin, Total: 7.1 ug/mL — ABNORMAL LOW (ref 10.0–20.0)

## 2020-08-25 LAB — BASIC METABOLIC PANEL
Anion gap: 7 (ref 5–15)
BUN: 8 mg/dL (ref 8–23)
CO2: 23 mmol/L (ref 22–32)
Calcium: 9.1 mg/dL (ref 8.9–10.3)
Chloride: 105 mmol/L (ref 98–111)
Creatinine, Ser: 0.54 mg/dL (ref 0.44–1.00)
GFR, Estimated: >60 mL/min (ref >60)
Glucose, Bld: 97 mg/dL (ref 70–99)
Potassium: 3.6 mmol/L (ref 3.5–5.1)
Sodium: 135 mmol/L (ref 135–145)

## 2020-08-25 LAB — CBC
HCT: 33.5 % — ABNORMAL LOW (ref 36.0–46.0)
Hemoglobin: 11.4 g/dL — ABNORMAL LOW (ref 12.0–15.0)
MCH: 29.2 pg (ref 26.0–34.0)
MCHC: 34 g/dL (ref 30.0–36.0)
MCV: 85.9 fL (ref 80.0–100.0)
Platelets: 275 10*3/uL (ref 150–400)
RBC: 3.9 MIL/uL (ref 3.87–5.11)
RDW: 14.4 % (ref 11.5–15.5)
WBC: 10.2 10*3/uL (ref 4.0–10.5)
nRBC: 0 % (ref 0.0–0.2)

## 2020-08-25 LAB — LIPID PANEL
Cholesterol: 205 mg/dL — ABNORMAL HIGH (ref 0–200)
HDL: 97 mg/dL (ref >40)
LDL Cholesterol: 102 mg/dL — ABNORMAL HIGH (ref 0–99)
Total CHOL/HDL Ratio: 2.1 RATIO
Triglycerides: 30 mg/dL (ref <150)
VLDL: 6 mg/dL (ref 0–40)

## 2020-08-25 LAB — HEMOGLOBIN A1C
Hgb A1c MFr Bld: 5 % (ref 4.8–5.6)
Mean Plasma Glucose: 96.8 mg/dL

## 2020-08-25 LAB — PHENYTOIN LEVEL, TOTAL: Phenytoin Lvl: 7.3 ug/mL — ABNORMAL LOW (ref 10.0–20.0)

## 2020-08-25 LAB — HIV ANTIBODY (ROUTINE TESTING W REFLEX): HIV Screen 4th Generation wRfx: NONREACTIVE

## 2020-08-25 MED ORDER — SODIUM CHLORIDE 0.9 % IV SOLN
200.0000 mg | Freq: Once | INTRAVENOUS | Status: AC
  Administered 2020-08-25: 200 mg via INTRAVENOUS
  Filled 2020-08-25: qty 20

## 2020-08-25 MED ORDER — LEVETIRACETAM IN NACL 500 MG/100ML IV SOLN: 500.0000 mg | Freq: Two times a day (BID) | INTRAVENOUS | Status: DC

## 2020-08-25 MED ORDER — ROSUVASTATIN CALCIUM 20 MG PO TABS
20.0000 mg | ORAL_TABLET | Freq: Every day | ORAL | Status: DC
  Administered 2020-08-25 – 2020-08-29 (×5): 20 mg via ORAL
  Filled 2020-08-25 (×5): qty 1

## 2020-08-25 MED ORDER — SODIUM CHLORIDE 0.9 % IV SOLN
300.0000 mg | Freq: Once | INTRAVENOUS | Status: AC
  Administered 2020-08-25: 300 mg via INTRAVENOUS
  Filled 2020-08-25: qty 6

## 2020-08-25 MED ORDER — LEVETIRACETAM IN NACL 500 MG/100ML IV SOLN
500.0000 mg | Freq: Two times a day (BID) | INTRAVENOUS | Status: DC
  Administered 2020-08-25: 500 mg via INTRAVENOUS
  Filled 2020-08-25: qty 100

## 2020-08-25 MED ORDER — PHENYTOIN SODIUM 50 MG/ML IJ SOLN
100.0000 mg | Freq: Three times a day (TID) | INTRAMUSCULAR | Status: AC
  Administered 2020-08-25 – 2020-08-27 (×7): 100 mg via INTRAVENOUS
  Filled 2020-08-25 (×7): qty 2

## 2020-08-25 MED ORDER — ROSUVASTATIN CALCIUM 20 MG PO TABS: 20.0000 mg | ORAL_TABLET | Freq: Every day | ORAL | Status: DC

## 2020-08-25 MED ORDER — AMLODIPINE BESYLATE 5 MG PO TABS
5.0000 mg | ORAL_TABLET | Freq: Every day | ORAL | Status: DC
  Administered 2020-08-25 – 2020-08-30 (×6): 5 mg via ORAL
  Filled 2020-08-25 (×8): qty 1

## 2020-08-25 MED ORDER — LORAZEPAM 2 MG/ML IJ SOLN
2.0000 mg | INTRAMUSCULAR | Status: DC | PRN
  Administered 2020-08-26: 2 mg via INTRAVENOUS
  Filled 2020-08-25: qty 1

## 2020-08-25 NOTE — Progress Notes (Signed)
Phenytoin Initial Consult Indication: Seizures (continuation of home regimen)  No Known Allergies  Patient Measurements: Height: 5\' 3"  (160 cm) Weight: 60.8 kg (134 lb 0.6 oz) IBW/kg (Calculated) : 52.4 TPN AdjBW (KG): 60.8 Body mass index is 23.74 kg/m.   Vital signs: Temp: 99.5 F (37.5 C) (06/24 1124) Temp Source: Oral (06/24 1124) BP: 119/95 (06/24 0747) Pulse Rate: 92 (06/24 0747)  Labs: Lab Results  Component Value Date/Time   Phenytoin Lvl 7.3 (L) 08/25/2020 1254   Lab Results  Component Value Date   PHENYTOIN 7.3 (L) 08/25/2020   Estimated Creatinine Clearance: 58 mL/min (by C-G formula based on SCr of 0.54 mg/dL).   Medications:  Medications Prior to Admission  Medication Sig Dispense Refill Last Dose   acetaminophen (TYLENOL) 500 MG tablet Take 1,000 mg by mouth daily.   08/23/2020 at 0800   amLODipine (NORVASC) 5 MG tablet Take 5 mg by mouth daily.   08/23/2020 at 0800   baclofen (LIORESAL) 10 MG tablet Take 5 mg by mouth 3 (three) times daily.   08/23/2020 at 1400   Cholecalciferol (VITAMIN D3) 50 MCG (2000 UT) TABS Take 2,000 Units by mouth daily.   08/23/2020 at 0800   clopidogrel (PLAVIX) 75 MG tablet Take 75 mg by mouth daily.   08/23/2020 at 0800   Ensure Plus (ENSURE PLUS) LIQD Take 237 mLs by mouth 2 (two) times daily. Strawberry flavor   08/23/2020 at 0800   ferrous sulfate 325 (65 FE) MG tablet Take 325 mg by mouth daily.   08/23/2020 at 0800   levETIRAcetam (KEPPRA) 250 MG tablet Take 125 mg by mouth 2 (two) times daily.   08/23/2020 at 0800   Multiple Vitamins-Minerals (MULTIVITAMIN WITH MINERALS) tablet Take 1 tablet by mouth daily.   08/23/2020 at 0800   Omega-3 Fatty Acids (FISH OIL) 1000 MG CAPS Take 2,000 mg by mouth 2 (two) times daily.   08/23/2020 at 0800   phenytoin (DILANTIN) 100 MG ER capsule Take 100 mg by mouth 2 (two) times daily. Take with 30mg  capsule for a total of 130mg  two times daily.   08/23/2020 at 0730   phenytoin (DILANTIN) 30 MG ER  capsule Take 30 mg by mouth 2 (two) times daily. Take with 100mg  capsule for a total of 130mg  two times daily.   08/23/2020 at 0800   sertraline (ZOLOFT) 100 MG tablet Take 50 mg by mouth daily.   08/23/2020 at 0800   simvastatin (ZOCOR) 20 MG tablet Take 20 mg by mouth at bedtime.   08/22/2020 at 2000    Assessment: 60 YOF who presented with altered mental status. MD felt the presentation to be most consistent with seizure activity and patient was loaded with IV Keppra and started on LTM EEG. Pharmacy consulted to resume home dilantin therapy.   Discussed case with neurology who agrees that it is okay to resume home dose as IV while awaiting phenytoin level and adjusting accordingly   Corrected phenytoin level (if needed): corrected 8.3  Seizure activity: none seen on EEG Significant potential drug interactions: Dilantin can decrease simastatin levels but patient is on home doses   Even though this is not a trough level, suspecting that it would be even lower. We will give a small bolus then increase the maintenance dose. Since this is a non-linear kinetic drug, a dose increase will not equate to 1:1 level increase.   Goals of care:  Total phenytoin level: 10-20 mcg/ml Free phenytoin level: 1-2 mcg/ml  Plan:  Phenytoin  300mg  IV x1  Increase maintenance to 100mg  TID Pharmacy will continue to follow regarding obtaining total phenytoin levels and dose adjustments as indicated.  , PharmD, BCIDP, AAHIVP, CPP Infectious Disease Pharmacist 08/25/2020 1:53 PM

## 2020-08-25 NOTE — Evaluation (Signed)
Physical Therapy Evaluation Patient Details Name: Casey Edwards MRN: [id number] DOB: October 06, 1955 Today's Date: 08/25/2020   History of Present Illness  65 yo female presents to ED on 6/23 with AMS, R gaze. CTH negative for acute findings. EEG suggestive of moderate to severe diffuse encephalopathy. Workup for seizures, post-ictal state. 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  Clinical Impression   Pt presents with impaired strength R>L chronically, L gaze preference with R inattention, impaired balance with L lateral leaning, impaired cognition, expressive and possible receptive difficulties suspect chronic, difficulty performing mobility tasks. Pt to benefit from acute PT to address deficits. Pt requiring max +2 assist for bed mobility and transfer to standing, unsure of pt baseline. PT recommending return to facility post-acutely. PT to progress mobility as tolerated, and will continue to follow acutely.      Follow Up Recommendations SNF;Other (comment) (return to facility)    Equipment Recommendations  None recommended by PT    Recommendations for Other Services       Precautions / Restrictions Precautions Precautions: Fall Precaution Comments: on EEG, handmitts, L gaze preference Restrictions Weight Bearing Restrictions: No      Mobility  Bed Mobility Overal bed mobility: Needs Assistance Bed Mobility: Supine to Sit;Sit to Supine;Rolling Rolling: Max assist   Supine to sit: Max assist;+2 for physical assistance Sit to supine: Max assist;+2 for physical assistance   General bed mobility comments: max +2 for all aspects bed mobility, increased time.    Transfers Overall transfer level: Needs assistance Equipment used: 2 person hand held assist Transfers: Sit to/from Stand Sit to Stand: Max assist;+2 physical assistance         General transfer comment: max +2 for power up with bed  pads, rise, LE blocking, and steadying  Ambulation/Gait             General Gait Details: nt  Information systems manager Rankin (Stroke Patients Only) Modified Rankin (Stroke Patients Only) Pre-Morbid Rankin Score: Severe disability Modified Rankin: Severe disability     Balance Overall balance assessment: Needs assistance Sitting-balance support: Single extremity supported;Feet supported Sitting balance-Leahy Scale: Poor Sitting balance - Comments: initially requiring min posterior assist to maintain upright sitting, periods of supervision. L lateral leaning resistant to postural corrections to upright Postural control: Left lateral lean Standing balance support: During functional activity Standing balance-Leahy Scale: Zero Standing balance comment: max +2                             Pertinent Vitals/Pain Pain Assessment: Faces Faces Pain Scale: Hurts a little bit Pain Location: LUE, during AAROM Pain Descriptors / Indicators: Discomfort;Grimacing Pain Intervention(s): Limited activity within patient's tolerance;Monitored during session;Repositioned    Home Living Family/patient expects to be discharged to:: Skilled nursing facility Living Arrangements: Alone                    Prior Function Level of Independence: Needs assistance   Gait / Transfers Assistance Needed: unsure of pt baseline, suspect wheelchair level given deficits  ADL's / Homemaking Assistance Needed: unsure of pt baseline, anticipate pt is total care and states "yes" to question "do they help you wash up?"        Hand Dominance        Extremity/Trunk Assessment   Upper Extremity Assessment  Upper Extremity Assessment: Defer to OT evaluation    Lower Extremity Assessment Lower Extremity Assessment: Generalized weakness;LLE deficits/detail;RLE deficits/detail;Difficult to assess due to impaired cognition RLE Deficits / Details: in supine,  pt with significant hypertonia in hip and knee extension, ankle DF. in sitting, PT able to range pt through knee flex/ext. deficits from chronic CVA    Cervical / Trunk Assessment Cervical / Trunk Assessment: Other exceptions Cervical / Trunk Exceptions: prefernce for L lateral leaning  Communication   Communication: Expressive difficulties (responds "thank you" mostly, occasionally yes/no)  Cognition Arousal/Alertness: Awake/alert Behavior During Therapy: WFL for tasks assessed/performed Overall Cognitive Status: History of cognitive impairments - at baseline                                 General Comments: Pt states "yes" she is at [facility], is from nursing facility. Follows commands inconsistently, moreso on L vs R given chronic CVA deficits and L gaze preference/R inattention      General Comments      Exercises     Assessment/Plan    PT Assessment Patient needs continued PT services  PT Problem List Decreased strength;Decreased safety awareness;Decreased mobility;Decreased activity tolerance;Decreased balance;Decreased knowledge of use of DME;Pain;Decreased cognition;Decreased knowledge of precautions;Decreased range of motion;Impaired tone       PT Treatment Interventions DME instruction;Therapeutic activities;Gait training;Therapeutic exercise;Patient/family education;Balance training;Functional mobility training;Neuromuscular re-education    PT Goals (Current goals can be found in the Care Plan section)  Acute Rehab PT Goals Patient Stated Goal: none stated PT Goal Formulation: With patient Time For Goal Achievement: 09/08/20 Potential to Achieve Goals: Fair    Frequency Min 2X/week   Barriers to discharge        Co-evaluation PT/OT/SLP Co-Evaluation/Treatment: Yes Reason for Co-Treatment: For patient/therapist safety;To address functional/ADL transfers;Complexity of the patient's impairments (multi-system involvement) PT goals addressed during  session: Mobility/safety with mobility;Balance         AM-PAC PT "6 Clicks" Mobility  Outcome Measure Help needed turning from your back to your side while in a flat bed without using bedrails?: A Lot Help needed moving from lying on your back to sitting on the side of a flat bed without using bedrails?: Total Help needed moving to and from a bed to a chair (including a wheelchair)?: Total Help needed standing up from a chair using your arms (e.g., wheelchair or bedside chair)?: Total Help needed to walk in hospital room?: Total Help needed climbing 3-5 steps with a railing? : Total 6 Click Score: 7    End of Session   Activity Tolerance: Patient tolerated treatment well Patient left: in bed;with call bell/phone within reach;with bed alarm set;Other (comment) (handmitts reapplied) Nurse Communication: Mobility status PT Visit Diagnosis: Other abnormalities of gait and mobility (R26.89);Hemiplegia and hemiparesis Hemiplegia - Right/Left: Right Hemiplegia - dominant/non-dominant: Dominant Hemiplegia - caused by: Cerebral infarction    Time: 1054-1110 PT Time Calculation (min) (ACUTE ONLY): 16 min   Charges:   PT Evaluation $PT Eval Low Complexity: 1 Low          Carloyn Lahue S, PT DPT Acute Rehabilitation Services Pager [phone]  Office [phone]   Jibran Crookshanks E Christain [place] 08/25/2020, 11:30 AM

## 2020-08-25 NOTE — Plan of Care (Signed)
  Problem: Education: Goal: Knowledge of disease or condition will improve Outcome: Progressing Goal: Knowledge of secondary prevention will improve Outcome: Progressing Goal: Knowledge of patient specific risk factors addressed and post discharge goals established will improve Outcome: Progressing Goal: Individualized Educational Video(s) Outcome: Progressing   Problem: Coping: Goal: Will verbalize positive feelings about self Outcome: Progressing Goal: Will identify appropriate support needs Outcome: Progressing   Problem: Health Behavior/Discharge Planning: Goal: Ability to manage health-related needs will improve Outcome: Progressing   Problem: Self-Care: Goal: Ability to participate in self-care as condition permits will improve Outcome: Progressing   Problem: Nutrition: Goal: Risk of aspiration will decrease Outcome: Progressing   

## 2020-08-25 NOTE — Progress Notes (Signed)
NEUROLOGY CONSULTATION PROGRESS NOTE   Date of service: August 25, 2020 Patient Name: Casey Edwards MRN:  [id number] DOB:  1956/01/17  Brief HPI  Casey Edwards is a 65 y.o. 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 Likely seizure, improved with Keppra 1500mg  IV once.  Started on cEEG.   Interval Hx   Improved today, having some episodes of crying. Brother is at bedside and reports that he has never seen her cry.  Vitals   Vitals:   08/25/20 0200 08/25/20 0400 08/25/20 0600 08/25/20 0747  BP: 127/66 128/90 125/72 (!) 119/95  Pulse: 89 94 92 92  Resp: 20 19 18 20   Temp:  98.9 F (37.2 C)  98 F (36.7 C)  TempSrc:  Temporal  Oral  SpO2: 97% 98% 98% 96%  Weight:      Height:         Body mass index is 23.74 kg/m.  Physical Exam   General: Laying comfortably in bed; in no acute distress.  HENT: Normal oropharynx and mucosa. Normal external appearance of ears and nose.  Neck: Supple, no pain or tenderness  CV: No JVD. No peripheral edema.  Pulmonary: Symmetric Chest rise. Normal respiratory effort.  Abdomen: Soft to touch, non-tender.  Ext: No cyanosis, edema, or deformity  Skin: No rash. Normal palpation of skin.   Musculoskeletal: Normal digits and nails by inspection. No clubbing.   Neurologic Examination  Mental status/Cognition: Somnolent but opens eyes to voice. Awake. Aphasic and unable to assess orientation. Speech/language: aphasia. Intermittently follows simple 1 step commands. No speech. Cranial nerves:   CN II Pupils equal and reactive to light, no VF deficits   CN III,IV,VI EOM intact, no gaze preference or deviation   CN V    CN VII no asymmetry, no nasolabial fold flattening   CN VIII Turns head to speech    CN IX & X    CN XI    CN XII midline tongue protrusion   Motor:  Muscle bulk: poor, tone normal Unable to do detailed strength testing but arms slowly drift down to the bed when held up. Can wiggle toes BL on command.  Sensation:  Light touch Decreased in RUE and RLE.   Pin prick    Temperature    Vibration   Proprioception    Coordination/Complex Motor:  Unable to assess but no obvious ataxia.  Labs   Basic Metabolic Panel:  Lab Results  Component Value Date   NA 135 08/25/2020   K 3.6 08/25/2020   CO2 23 08/25/2020   GLUCOSE 97 08/25/2020   BUN 8 08/25/2020   CREATININE 0.54 08/25/2020   CALCIUM 9.1 08/25/2020   GFRNONAA >60 08/25/2020   GFRAA >60 04/20/2019   HbA1c:  Lab Results  Component Value Date   HGBA1C 5.0 08/25/2020   LDL:  Lab Results  Component Value Date   LDLCALC 102 (H) 08/25/2020   Urine Drug Screen: No results found for: LABOPIA, COCAINSCRNUR, LABBENZ, AMPHETMU, THCU, LABBARB  Alcohol Level     Component Value Date/Time   [facility] <10 08/24/2020 0840   Lab Results  Component Value Date   PHENYTOIN <2.5 (L) 04/20/2019   Lab Results  Component Value Date   PHENYTOIN <2.5 (L) 04/20/2019  Imaging and Diagnostic studies  Results for orders placed during the hospital encounter of 08/24/20  MR BRAIN WO CONTRAST(personally reviewed) No acute infarction. Motion degraded.  cEEG: - Rhythmic activity on the Right with no clear evolution.  Impression   Casey Edwards is a 65 y.o. female admitted with a prolonged seizure and noted to have R gaze deviation with stiffness in RUE and RLE on presentation. She is on subtherapeutic dose of Keppra at home(125mg  BID) and low dose of Phenytoin 130mg  BID.  MRI Brain negative for an acute stroke. cEEG with rhythmic R sided activity with no clear evolution. Will continue cEEG for another day. She is improved from yesterday so for now will monitor her on cEEG rather than treat.  Recommendations  -  cEEG, will continue for another day  - continue Keppra 500mg  BID - continue Phenytoin 130mg  BID. - Seizure precautions and Ativan 2mg  for seizure lasting more than 5 mins. ______________________________________________________________________   Thank you for the opportunity to take part in the care of this patient. If you have any further questions, please contact the neurology consultation attending.  Signed,  [facility] Pager Number 

## 2020-08-25 NOTE — Evaluation (Signed)
Occupational Therapy Evaluation Patient Details Name: Casey Edwards MRN: [id number] DOB: 07/28/1955 Today's Date: 08/25/2020    History of Present Illness 65 yo female presents to ED on 6/23 with AMS, R gaze. CTH negative for acute findings. EEG suggestive of moderate to severe diffuse encephalopathy. Workup for seizures, post-ictal state. 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   Clinical Impression   Patient admitted for the diagnosis above.  PTA it appears she was at an ALF, and was most likely wheelchair level, and assist as needed for ADL performance.  She is most likely close to her baseline, and OT will transition care to nursing staff.  Recommend a return to prior environment, at prior level of supports.  No acute OT indicated.      Follow Up Recommendations  SNF    Equipment Recommendations  None recommended by OT    Recommendations for Other Services       Precautions / Restrictions Precautions Precautions: Fall Precaution Comments: on EEG, handmitts, L gaze preference Restrictions Weight Bearing Restrictions: No      Mobility Bed Mobility Overal bed mobility: Needs Assistance Bed Mobility: Supine to Sit;Sit to Supine;Rolling Rolling: Max assist   Supine to sit: Max assist;+2 for physical assistance Sit to supine: Max assist;+2 for physical assistance   General bed mobility comments: max +2 for all aspects bed mobility, increased time. Patient Response: Cooperative  Transfers Overall transfer level: Needs assistance Equipment used: 2 person hand held assist Transfers: Sit to/from Stand Sit to Stand: Max assist;+2 physical assistance         General transfer comment: max +2 for power up with bed pads, rise, LE blocking, and steadying    Balance Overall balance assessment: Needs assistance Sitting-balance support: Single extremity supported;Feet supported Sitting  balance-Leahy Scale: Poor Sitting balance - Comments: initially requiring min posterior assist to maintain upright sitting, periods of supervision. L lateral leaning resistant to postural corrections to upright Postural control: Left lateral lean Standing balance support: During functional activity Standing balance-Leahy Scale: Zero Standing balance comment: max +2                           ADL either performed or assessed with clinical judgement   ADL Overall ADL's : At baseline                                       General ADL Comments: most likely at baseline.  transition care to nursing staff at [facility]     Vision Patient Visual Report: Peripheral vision impairment Alignment/Gaze Preference: Gaze left;Head turned Additional Comments: difficult to assess given cognition.     Perception     Praxis      Pertinent Vitals/Pain Pain Assessment: Faces Faces Pain Scale: No hurt Pain Location: LUE, during AAROM Pain Descriptors / Indicators: Discomfort;Grimacing Pain Intervention(s): Monitored during session     Hand Dominance     Extremity/Trunk Assessment Upper Extremity Assessment Upper Extremity Assessment: RUE deficits/detail;Generalized weakness RUE Deficits / Details: non functional post stroke RUE Sensation: WNL RUE Coordination: decreased fine motor;decreased gross motor      Cervical / Trunk Assessment Cervical / Trunk Assessment: Other exceptions Cervical / Trunk Exceptions: prefernce for L lateral leaning   Communication Communication Communication: Expressive difficulties   Cognition Arousal/Alertness: Awake/alert Behavior During Therapy: WFL for  tasks assessed/performed Overall Cognitive Status: History of cognitive impairments - at baseline                                                     Home Living Family/patient expects to be discharged to:: Assisted living Living Arrangements: Alone;Other  (Comment) (staff assist)                           Home Equipment: Wheelchair - manual          Prior Functioning/Environment Level of Independence: Needs assistance  Gait / Transfers Assistance Needed: assist with transfers and probably wheelchair level ADL's / Homemaking Assistance Needed: Assist as needed for all ADL            OT Problem List: Decreased strength;Decreased range of motion;Impaired balance (sitting and/or standing);Impaired UE functional use      OT Treatment/Interventions:      OT Goals(Current goals can be found in the care plan section) Acute Rehab OT Goals Patient Stated Goal: none stated OT Goal Formulation: Patient unable to participate in goal setting Time For Goal Achievement: 08/25/20 Potential to Achieve Goals: Fair  OT Frequency:     Barriers to D/C:            Co-evaluation PT/OT/SLP Co-Evaluation/Treatment: Yes Reason for Co-Treatment: Complexity of the patient's impairments (multi-system involvement);For patient/therapist safety PT goals addressed during session: Mobility/safety with mobility;Balance OT goals addressed during session: ADL's and self-care      AM-PAC OT "6 Clicks" Daily Activity     Outcome Measure Help from another person eating meals?: A Lot Help from another person taking care of personal grooming?: A Lot Help from another person toileting, which includes using toliet, bedpan, or urinal?: Total Help from another person bathing (including washing, rinsing, drying)?: A Lot Help from another person to put on and taking off regular upper body clothing?: A Lot Help from another person to put on and taking off regular lower body clothing?: Total 6 Click Score: 10   End of Session    Activity Tolerance: Patient tolerated treatment well Patient left: in bed;with call bell/phone within reach;with bed alarm set;with restraints reapplied  OT Visit Diagnosis: Muscle weakness (generalized) (M62.81);Other  symptoms and signs involving cognitive function;Hemiplegia and hemiparesis Hemiplegia - Right/Left: Right                Time: 9741-6384 OT Time Calculation (min): 16 min Charges:  OT General Charges $OT Visit: 1 Visit OT Evaluation $OT Eval Moderate Complexity: 1 Mod  08/25/2020  Rich, OTR/L  Acute Rehabilitation Services  Office:  [phone]   Suzanna Obey 08/25/2020, 11:47 AM

## 2020-08-25 NOTE — Progress Notes (Signed)
LTM maint complete - serviced T8, A2, Fp1  Plugged in machine to outlet and connected to Internet.  Notified nursing patient needed assistance, stayed with patient until nursing arrived.

## 2020-08-25 NOTE — Progress Notes (Signed)
   Subjective:   Ms. Hitchman remains nonverbal on exam.  She is awake and alert.  Nods yes to most questions.  Unable to follow simple commands.  Does appear to recognize her brother who is also in the room.  Brother, Meredith Staggers, states that at baseline patient does have right-sided weakness and aphasia but otherwise she is fairly independent and is able to use nonverbal cues for communication.  She moved to [facility] about a month ago after their mother passed.  Objective:  Vital signs in last 24 hours: Vitals:   08/24/20 2200 08/25/20 0000 08/25/20 0200 08/25/20 0400  BP: 137/90 124/80 127/66 128/90  Pulse: 85 85 89 94  Resp: 19 18 20 19   Temp:  98.8 F (37.1 C)  98.9 F (37.2 C)  TempSrc:  Temporal  Temporal  SpO2: 99% 98% 97% 98%  Weight:      Height:       Physical exam: General: Well-nourished, chronically ill-appearing elderly female, lying in bed, NAD. CV: Normal rate and regular rhythm, no murmurs rubs or gallops noted. Pulm: Clear to auscultation bilaterally, no adventitious sounds noted. Neuro: Alert, aphasic, unable to follow simple commands.  Moved upper extremities spontaneously but not to command.  Unable to conduct full neurological examination. Skin: Warm and dry.  Assessment/Plan:  Principal Problem:   Seizure (HCC) Active Problems:   CVA (cerebral vascular accident) (HCC)  Acute metabolic encephalopathy Hx of L hemispheric CVA w/ residual aphasia and right-sided weakness Hx of seizure disorder CT head negative, MRI brain motion degraded but with no acute intracranial abnormalities.  Continuous EEG showing moderate to severe encephalopathy diffusely, no seizures or epileptiform discharges noted.  Less likely that patient's encephalopathy is from an acute CVA given negative imaging.  Labs have been reassuring with no electrolyte abnormalities or signs of infection seen.  Centrally acting medications including home sertraline and home baclofen have  been discontinued.  At this time, I suspect that patient's symptoms are likely from postictal state 2/2 breakthrough seizure.   -Greatly appreciate neurology assistance -Continuous EEG to evaluate for further seizure activity -HbA1c 5% -LDL 102, total cholesterol 205 >> will transition from simvastatin to rosuvastatin 20mg  to bring LDL to goal (<70) -Continue Plavix and added rosuvastatin for secondary prevention -Continue Keppra 500mg  BID and Dilantin 130 BID per neuro -Ativan as needed for seizure >5 minutes -PT/OT eval -Dysphagia 1 diet per SLP  Hypertension -Restarted home Norvasc 5 mg daily  Prior to Admission Living Arrangement: Assisted living facility Anticipated Discharge Location: TBD Barriers to Discharge: Continued medical management Dispo: Anticipated discharge in approximately 2-3 day(s).   , MD 08/25/2020, 6:30 AM Pager: [phone] After 5pm on weekdays and 1pm on weekends: On Call pager [phone]

## 2020-08-25 NOTE — Progress Notes (Signed)
EEG maintenance performed.  Reapplied Fp1, Fp2, Fz electrodes.  No skin breakdown observed at these electrode sites.

## 2020-08-25 NOTE — Care Management (Signed)
1331 08-25-20 Case Manager called the [facility] Notification Center- Referral ID is [id number] and the Notification ID is [phone]. Patient uses the [facility], PCP is [place], and CSW is Mallie Darting at [phone] ext [phone]. TOC team will continue to follow for additional needs.

## 2020-08-25 NOTE — Evaluation (Signed)
Clinical/Bedside Swallow Evaluation Patient Details  Name: Casey Edwards MRN: [id number] Date of Birth: [birth year]  Today's Date: 08/25/2020 Time: SLP Start Time (ACUTE ONLY): 0756 SLP Stop Time (ACUTE ONLY): 0815 SLP Time Calculation (min) (ACUTE ONLY): 19 min  Past Medical History:  Past Medical History:  Diagnosis Date   Hypercholesteremia    Hypertension    Seizures (HCC)    Stroke (HCC)    Vitamin D deficiency    Past Surgical History: No past surgical history on file. HPI:  Casey Edwards is a 65 yo F w/ PMH of Cva w/ residual aphasia, right sided weakness, seizure disorder, htn, hld presenting to [facility] with new onset left sided weakness and altered mental status likely 2/2 post-ictal state from breakthrough seizure vs acute CVA   Assessment / Plan / Recommendation Clinical Impression  Pt demonstrates impairments in cognition and awareness that impact her ability to consistently recognize and consume PO. She is just waking up this am, intermittently moaning and crying. Hearing her brother at bedside elicited moments of focused attention to environment. With careful techniques to achieve pts awarenes of sips and bites pt tolerated water and puree, though mostly she turned head away. Anticipate that pts arousal will improve throught the day. Will intiate a puree and thin liquid diet for now so that pt may be offered sips and bites when/if alert. If her arousal does not consistently improve, may need to consider Cortrak. Will f/u for upgrade as appropriate SLP Visit Diagnosis: Dysphagia, oral phase (R13.11)    Aspiration Risk  Risk for inadequate nutrition/hydration;Moderate aspiration risk    Diet Recommendation Dysphagia 1 (Puree);Thin liquid   Liquid Administration via: Cup;Straw Medication Administration: Crushed with puree Compensations: Slow rate;Small sips/bites;Minimize environmental distractions Postural Changes: Seated upright at 90 degrees    Other  Recommendations Oral  Care Recommendations: Oral care BID   Follow up Recommendations Skilled Nursing facility      Frequency and Duration min 2x/week  2 weeks       Prognosis Prognosis for Safe Diet Advancement: Good      Swallow Study   General HPI: Casey Edwards is a 65 yo F w/ PMH of Cva w/ residual aphasia, right sided weakness, seizure disorder, htn, hld presenting to [facility] with new onset left sided weakness and altered mental status likely 2/2 post-ictal state from breakthrough seizure vs acute CVA Type of Study: Bedside Swallow Evaluation Previous Swallow Assessment: none in chart Diet Prior to this Study: NPO Temperature Spikes Noted: No Respiratory Status: Room air History of Recent Intubation: No Behavior/Cognition: Distractible Oral Cavity - Dentition: Edentulous;Dentures, not available Vision: Impaired for self-feeding Self-Feeding Abilities: Total assist Patient Positioning: Postural control interferes with function Baseline Vocal Quality: Not observed Volitional Cough: Cognitively unable to elicit Volitional Swallow: Unable to elicit    Oral/Motor/Sensory Function Overall Oral Motor/Sensory Function: Generalized oral weakness   Ice Chips Ice chips: Not tested   Thin Liquid Thin Liquid: Impaired Presentation: Straw Oral Phase Impairments: Poor awareness of bolus    Nectar Thick Nectar Thick Liquid: Not tested   Honey Thick Honey Thick Liquid: Not tested   Puree Puree: Impaired Oral Phase Impairments: Poor awareness of bolus   Solid     Solid: Not tested     Casey Ditty, MA CCC-SLP  Acute Rehabilitation Services Pager [phone] Office [phone]  Casey Edwards 08/25/2020,9:29 AM

## 2020-08-25 NOTE — Procedures (Signed)
Patient Name: Casey Edwards  MRN: [id number]  Epilepsy Attending: Charlsie Quest  Referring Physician/Provider: Lanae Boast, NP Duration: 08/24/2020 1037 to 08/25/2020 1037   Patient history: 65 year old female with history of stroke and residual right-sided weakness,aphasia found to be acutely altered with right gaze preference.  EEG to evaluate for seizures.   Level of alertness: lethargic   AEDs during EEG study: LEV, PHT   Technical aspects: This EEG study was done with scalp electrodes positioned according to the 10-20 International system of electrode placement. Electrical activity was acquired at a sampling rate of 500Hz  and reviewed with a high frequency filter of 70Hz  and a low frequency filter of 1Hz . EEG data were recorded continuously and digitally stored.   Description: No clear posterior dominant rhythm was seen.  Continuous low amplitude 2 to 3 Hz delta slowing was noted in left hemisphere.  EEG also showed sharply contoured 4 to 5 Hz theta slowing in right frontotemporal region which gradually evolved to 2 to 3 Hz delta slowing and involve all of right hemisphere consistent with seizure. On video, patient was noted to be laying in bed without any clinical signs.  Average 4-5 seizures were noted per hour, lasting about 1 minute each.  Hyperventilation and photic stimulation were not performed.      ABNORMALITY - Focal seizure without clinical signs, right frontotemporal region - Continuous slow, left hemisphere   IMPRESSION: This study showed seizures without clinical signs arising from right frontotemporal region, average 4-5 seizures per hour, lasting about 1 minute each, last seizure noted on 08/25/2020 at 1022.  Additionally there is evidence of cortical dysfunction arising from left hemisphere likely secondary to underlying structural abnormality/stroke.     [name] 

## 2020-08-26 LAB — CBC WITH DIFFERENTIAL/PLATELET
Abs Immature Granulocytes: 0.04 10*3/uL (ref 0.00–0.07)
Basophils Absolute: 0 10*3/uL (ref 0.0–0.1)
Basophils Relative: 0 %
Eosinophils Absolute: 0.1 10*3/uL (ref 0.0–0.5)
Eosinophils Relative: 1 %
HCT: 35.8 % — ABNORMAL LOW (ref 36.0–46.0)
Hemoglobin: 12 g/dL (ref 12.0–15.0)
Immature Granulocytes: 0 %
Lymphocytes Relative: 20 %
Lymphs Abs: 2 10*3/uL (ref 0.7–4.0)
MCH: 28.8 pg (ref 26.0–34.0)
MCHC: 33.5 g/dL (ref 30.0–36.0)
MCV: 86.1 fL (ref 80.0–100.0)
Monocytes Absolute: 1.4 10*3/uL — ABNORMAL HIGH (ref 0.1–1.0)
Monocytes Relative: 14 %
Neutro Abs: 6.5 10*3/uL (ref 1.7–7.7)
Neutrophils Relative %: 65 %
Platelets: 277 10*3/uL (ref 150–400)
RBC: 4.16 MIL/uL (ref 3.87–5.11)
RDW: 14 % (ref 11.5–15.5)
WBC: 10.1 10*3/uL (ref 4.0–10.5)
nRBC: 0 % (ref 0.0–0.2)

## 2020-08-26 LAB — BASIC METABOLIC PANEL
Anion gap: 10 (ref 5–15)
BUN: 6 mg/dL — ABNORMAL LOW (ref 8–23)
CO2: 24 mmol/L (ref 22–32)
Calcium: 10 mg/dL (ref 8.9–10.3)
Chloride: 103 mmol/L (ref 98–111)
Creatinine, Ser: 0.54 mg/dL (ref 0.44–1.00)
GFR, Estimated: >60 mL/min (ref >60)
Glucose, Bld: 127 mg/dL — ABNORMAL HIGH (ref 70–99)
Potassium: 3.8 mmol/L (ref 3.5–5.1)
Sodium: 137 mmol/L (ref 135–145)

## 2020-08-26 LAB — PHENYTOIN LEVEL, TOTAL: Phenytoin Lvl: 13 ug/mL (ref 10.0–20.0)

## 2020-08-26 LAB — CK: Total CK: 154 U/L (ref 38–234)

## 2020-08-26 MED ORDER — LEVETIRACETAM IN NACL 1500 MG/100ML IV SOLN
1500.0000 mg | Freq: Once | INTRAVENOUS | Status: AC
  Administered 2020-08-26: 1500 mg via INTRAVENOUS
  Filled 2020-08-26: qty 100

## 2020-08-26 MED ORDER — LEVETIRACETAM IN NACL 1500 MG/100ML IV SOLN
1500.0000 mg | Freq: Two times a day (BID) | INTRAVENOUS | Status: AC
  Administered 2020-08-26 – 2020-08-27 (×4): 1500 mg via INTRAVENOUS
  Filled 2020-08-26 (×4): qty 100

## 2020-08-26 MED ORDER — SODIUM CHLORIDE 0.9 % IV SOLN
200.0000 mg | Freq: Two times a day (BID) | INTRAVENOUS | Status: AC
  Administered 2020-08-26 – 2020-08-27 (×5): 200 mg via INTRAVENOUS
  Filled 2020-08-26 (×6): qty 20

## 2020-08-26 MED ORDER — SODIUM CHLORIDE 0.9 % IV SOLN
200.0000 mg | Freq: Once | INTRAVENOUS | Status: AC
  Administered 2020-08-26: 200 mg via INTRAVENOUS
  Filled 2020-08-26: qty 4

## 2020-08-26 MED ORDER — QUETIAPINE FUMARATE 25 MG PO TABS
25.0000 mg | ORAL_TABLET | Freq: Every evening | ORAL | Status: DC | PRN
  Administered 2020-08-26: 25 mg via ORAL
  Filled 2020-08-26: qty 1

## 2020-08-26 NOTE — Progress Notes (Addendum)
NEUROLOGY CONSULTATION PROGRESS NOTE   Date of service: August 26, 2020 Patient Name: Casey Edwards MRN:  [id number] DOB:  [birth year]  Brief HPI  Casey Edwards is a 65 y.o. 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 Likely seizure, improved with Keppra 1500mg  IV once.   Interval Hx   cEEG with brief R hemispheric seizures ~4-5/hour. Loaded with Phenytoin and Vimpat and maintenance doses increased. cEEG improved to Right PLEDs on LEV, PHT and LCM.  More interactive today.  Vitals   Vitals:   08/25/20 2100 08/25/20 2327 08/26/20 0411 08/26/20 0722  BP: (!) 146/92 (!) 142/86 131/74 (!) 146/89  Pulse: 88 (!) 108 75 97  Resp: 16 20 16 20   Temp: 99 F (37.2 C)  (!) 97.5 F (36.4 C) 99.8 F (37.7 C)  TempSrc: Oral  Axillary Oral  SpO2: 98%   100%  Weight:      Height:         Body mass index is 23.74 kg/m.  Physical Exam   General: Laying comfortably in bed; in no acute distress.  HENT: Normal oropharynx and mucosa. Normal external appearance of ears and nose.  Neck: Supple, no pain or tenderness  CV: No JVD. No peripheral edema.  Pulmonary: Symmetric Chest rise. Normal respiratory effort.  Abdomen: Soft to touch, non-tender.  Ext: No cyanosis, edema, or deformity  Skin: No rash. Normal palpation of skin.   Musculoskeletal: Normal digits and nails by inspection. No clubbing.   Neurologic Examination(requires a lot of encouragement to participate with exam)  Mental status/Cognition: eyes open, smiles. Awake. Speech/language: More vocal and smiling, says "yes". Follows 1 step commands more consistently today. Cranial nerves:   CN II Pupils equal and reactive to light, no VF deficits   CN III,IV,VI EOM intact, no gaze preference  or deviation   CN V    CN VII no asymmetry, no nasolabial fold flattening   CN VIII Turns head to speech   CN IX & X    CN XI    CN XII midline tongue protrusion   Motor:  Muscle bulk: poor, tone normal. Can lift BL arms up off the bed. Can wiggle toes BL on command.  Sensation:  Light touch Decreased in RUE and RLE.   Pin prick    Temperature    Vibration   Proprioception    Coordination/Complex Motor:  No obvious ataxia in BL upper extremities but unable to test it well as patient is in mitts due to pulling on EEG leads.  Labs   Basic Metabolic Panel:  Lab Results  Component Value Date   NA 137 08/26/2020   K 3.8 08/26/2020   CO2 24 08/26/2020   GLUCOSE 127 (H) 08/26/2020   BUN 6 (L) 08/26/2020   CREATININE 0.54 08/26/2020   CALCIUM 10.0 08/26/2020   GFRNONAA >60 08/26/2020   GFRAA >60 04/20/2019   HbA1c:  Lab Results  Component Value Date   HGBA1C 5.0 08/25/2020   LDL:  Lab Results  Component Value Date   LDLCALC 102 (H) 08/25/2020   Urine Drug Screen: No results found for: LABOPIA, COCAINSCRNUR, LABBENZ, AMPHETMU, THCU, LABBARB  Alcohol Level     Component Value Date/Time   ETH <10 08/24/2020 0840   Lab  Results  Component Value Date   PHENYTOIN 13.0 08/26/2020   Lab Results  Component Value Date   PHENYTOIN 13.0 08/26/2020    Imaging and Diagnostic studies  Results for orders placed during the hospital encounter of 08/24/20  MR BRAIN WO CONTRAST(personally reviewed) No acute infarction. Motion degraded.  cEEG: This study showed seizures arising from right frontotemporal region during which either no clinical signs were noted or patient was less responsive with left hand tremor like movement.  At the beginning of study. average 4-5 seizures per hour, lasting about 1 minute each  were noted. As AEDs were adjusted, seizures improved and EEG showed lateralized periodic discharges arising from right hemisphere which is suggestive of underlying  structural abnormality. Additionally there is evidence of cortical dysfunction arising from left hemisphere likely secondary to underlying stroke.     EEG appears to be improving compared to previous day.  Impression   Casey Edwards is a 65 y.o. female admitted with a prolonged seizure and noted to have R gaze deviation with stiffness in RUE and RLE on presentation. She is on subtherapeutic dose of Keppra at home(125mg  BID) and low dose of Phenytoin 130mg  BID.  MRI Brain negative for an acute stroke. cEEG initially with brief 4-5 seizures per hour which improved to PLEDs on Keppra 1500mg  BID, Dilantin 100mg  TID and Vimpat 200mg  BID. Will continue cEEG for another day. Recommendations  - cEEG, will continue for another day  - continue Keppra 1500mg  BID - continue Phenytoin 100mg  TID. - continue Vimpat 200mg  BID - Seizure precautions and Ativan 2mg  for seizure lasting more than 5 mins. - we will continue to follow along. ______________________________________________________________________   Thank you for the opportunity to take part in the care of this patient. If you have any further questions, please contact the neurology consultation attending.  Signed,  [facility] Pager Number 

## 2020-08-26 NOTE — Progress Notes (Signed)
RN called/ pt became agitated and removed most of her leads. Tech reapplied with new set of electrodes. Wrapped head. Pt has mittts

## 2020-08-26 NOTE — Procedures (Addendum)
Patient Name: Casey Edwards  MRN: [id number]  Epilepsy Attending: Charlsie Quest  Referring Physician/Provider: Lanae Boast, NP Duration: 08/25/2020 1037 to 08/26/2020 1037   Patient history: 65 year old female with history of stroke and residual right-sided weakness,aphasia found to be acutely altered with right gaze preference.  EEG to evaluate for seizures.   Level of alertness: lethargic   AEDs during EEG study: LEV, PHT, LCM   Technical aspects: This EEG study was done with scalp electrodes positioned according to the 10-20 International system of electrode placement. Electrical activity was acquired at a sampling rate of 500Hz  and reviewed with a high frequency filter of 70Hz  and a low frequency filter of 1Hz . EEG data were recorded continuously and digitally stored.   Description: No clear posterior dominant rhythm was seen.  Continuous low amplitude 2 to 3 Hz delta slowing was noted in left hemisphere.  EEG also showed sharply contoured 4 to 5 Hz theta slowing in right frontotemporal region which gradually evolved to 2 to 3 Hz delta slowing and involve all of right hemisphere consistent with seizure. On video, patient was noted to be laying in bed without any clinical signs.  At the beginning of study, there were average 4-5 seizures per hour, lasting about 1 minute each. As AEDs were adjusted, frequency of seizures improved and EEG showed Lateralized periodic discharges in right hemisphere at 0.5-1Hz   Event button was pressed 07/25/2020 at 1529, 1551, 1555, 1602, 1605, 1617, 2306. Patient was noted to be less responsive and at times with left hand tremor like movement.  Concomitant EEG showed sharply contoured 4 to 5 Hz theta slowing in right frontotemporal region which gradually evolved to 2 to 3 Hz delta slowing and involve all of right hemisphere consistent with seizure.  Event button was pressed 07/26/2020 at 0714 for left hand tremor. Concomitant EEG showed significant movement  which was difficult to interpret. However focal motor seizures may not be seen on scalp eeg.   Event button was pressed 07/26/2020 at 0931 and 0944 for unclear reasons. Concomitant EEG showed generalized and lateralized right hemisphere 3-5hz  theta-delta slowing without definite evolution.   ABNORMALITY - Focal seizure, right frontotemporal region - Lateralized periodic discharges, right hemisphere - Continuous slow, left hemisphere   IMPRESSION: This study showed seizures arising from right frontotemporal region during which either no clinical signs were noted or patient was less responsive with left hand tremor like movement.  At the beginning of study. average 4-5 seizures per hour, lasting about 1 minute each  were noted. As AEDs were adjusted, seizures improved and EEG showed lateralized periodic discharges arising from right hemisphere which is suggestive of underlying structural abnormality. Additionally there is evidence of cortical dysfunction arising from left hemisphere likely secondary to underlying stroke.    EEG appears to be improving compared to previous day.   Dima Ferrufino [phone]

## 2020-08-26 NOTE — Progress Notes (Signed)
   Subjective:   Overnight events: Yesterday afternoon, patient was noted to be having seizures 4-5 per hour, each last last than 1 minute. Neurology recommended a loading dose of Vimpat, as well as increasing Keppra and Phenytoin dosages.   This AM, Casey Edwards is much more responsive. She answers to questions with yes/no, thank you, "lordy," although her replies are not always intelligible. Her brother is present at bedside and agrees that Casey Edwards is improving but not quite at baseline.  He states that at baseline, patient can walk and perform her ADLs.  We discussed next steps, including further adjustment of her medications per Neurology recommends. Mr. Cordoba states he was told that an epileptologist would be consulted.   Objective:  Vital signs in last 24 hours: Vitals:   08/25/20 1505 08/25/20 2100 08/25/20 2327 08/26/20 0411  BP: 124/69 (!) 146/92 (!) 142/86 131/74  Pulse: 88 88 (!) 108 75  Resp: 20 16 20 16   Temp: 98.6 F (37 C) 99 F (37.2 C)  (!) 97.5 F (36.4 C)  TempSrc: Oral Oral  Axillary  SpO2: 100% 98%    Weight:      Height:       Physical exam: General: Well-nourished, chronically ill-appearing elderly female, lying in bed, NAD. Pulm: No respiratory distress noted. No increased work of breathing.  Neuro: Alert, intermittently able to follow questions and commands. Able to move all extremities. More focal today.  Skin: Warm and dry.  Assessment/Plan:  Principal Problem:   Encephalopathy Active Problems:   Aphasia as late effect of cerebrovascular accident   Dementia (HCC)   Right hemiparesis (HCC)   Seizure disorder (HCC)   CVA (cerebral vascular accident) (HCC)  Breakthrough Seizures History of Seizure Disorder Patient has a known history of seizure disorder secondary to previous CVA for which she was treated with low doses of Keppra and phenytoin prior to admission.  EEG yesterday demonstrated persistent seizures lasting less than 1 minute,  several every hour, that did not have clinical features.  She was loaded with Vimpat, in addition to increases of her Keppra and phenytoin doses.  Per neurology's note, seizures have improved.  I suspect that patient's encephalopathy since admission is secondary to postictal state; this appears to be slowly improving today.  -Neurology following; appreciate their assistance -Continuous EEG  -Continue Keppra 500mg  BID and Dilantin 130 BID per neuro -Ativan as needed for seizure >5 minutes -PT/OT eval -Dysphagia 1 diet per SLP  Hx of L hemispheric CVA w/ residual aphasia and right-sided weakness Simvastatin was switched to Crestor given LDL above goal.  -Continue home Plavix -Continue Crestor  Hypertension -Continue home Norvasc 5 mg daily  Prior to Admission Living Arrangement: Assisted living facility Anticipated Discharge Location: TBD Barriers to Discharge: Continued medical management  Dispo: Anticipated discharge in approximately 2-3 day(s).   Dr. Internal Medicine PGY-2  Pager: [phone] After 5pm on weekdays and 1pm on weekends: On Call pager [phone]  08/26/2020, 6:44 AM

## 2020-08-27 LAB — GLUCOSE, CAPILLARY: Glucose-Capillary: 138 mg/dL — ABNORMAL HIGH (ref 70–99)

## 2020-08-27 MED ORDER — PHENYTOIN SODIUM EXTENDED 100 MG PO CAPS
100.0000 mg | ORAL_CAPSULE | Freq: Three times a day (TID) | ORAL | Status: DC
  Administered 2020-08-28 – 2020-08-30 (×8): 100 mg via ORAL
  Filled 2020-08-27 (×8): qty 1

## 2020-08-27 MED ORDER — LEVETIRACETAM 750 MG PO TABS
1500.0000 mg | ORAL_TABLET | Freq: Two times a day (BID) | ORAL | Status: DC
  Administered 2020-08-28 – 2020-08-30 (×5): 1500 mg via ORAL
  Filled 2020-08-27 (×6): qty 2

## 2020-08-27 MED ORDER — LACOSAMIDE 200 MG PO TABS
200.0000 mg | ORAL_TABLET | Freq: Two times a day (BID) | ORAL | Status: DC
  Administered 2020-08-28 – 2020-08-30 (×5): 200 mg via ORAL
  Filled 2020-08-27: qty 4
  Filled 2020-08-27 (×5): qty 1

## 2020-08-27 NOTE — NC FL2 (Signed)
[place] MEDICAID FL2 LEVEL OF CARE SCREENING TOOL     IDENTIFICATION  Patient Name: Casey Edwards Birthdate: [birth year] Sex: female Admission Date (Current Location): 08/24/2020  [facility] and [place] Number:  Producer, television/film/video and Address:  [facility] [place], [address] [place], [place] [address]      Provider Number: [id number]  Attending Physician Name and Address:  Tyson Alias, *  Relative Name and Phone Number:  Renne Musca, [phone]    Current Level of Care: Hospital Recommended Level of Care: Skilled Nursing Facility Prior Approval Number:    Date Approved/Denied:   PASRR Number: [id number]  Discharge Plan: SNF    Current Diagnoses: Patient Active Problem List   Diagnosis Date Noted   Anemia of chronic disease 08/25/2020   Aphasia as late effect of cerebrovascular accident 08/25/2020   Dementia (HCC) 08/25/2020   Right hemiparesis (HCC) 08/25/2020   Encephalopathy 08/25/2020   Seizure disorder (HCC) 08/25/2020   CVA (cerebral vascular accident) (HCC) 08/25/2020   Essential hypertension 07/22/2018   History of stroke 07/22/2018   Major depressive disorder 07/22/2018   Other hyperlipidemia 07/22/2018    Orientation RESPIRATION BLADDER Height & Weight     Self  Normal Incontinent, External catheter Weight: 134 lb 0.6 oz (60.8 kg) Height:  5\' 3"  (160 cm)  BEHAVIORAL SYMPTOMS/MOOD NEUROLOGICAL BOWEL NUTRITION STATUS      Incontinent Diet (See DC summary)  AMBULATORY STATUS COMMUNICATION OF NEEDS Skin   Extensive Assist Verbally Normal                       Personal Care Assistance Level of Assistance  Bathing, Feeding, Dressing Bathing Assistance: Maximum assistance Feeding assistance: Maximum assistance Dressing Assistance: Maximum assistance     Functional Limitations Info  Sight, Hearing, Speech Sight Info: Impaired Hearing Info: Adequate Speech Info: Impaired    SPECIAL CARE FACTORS  FREQUENCY  PT (By licensed PT), OT (By licensed OT)     PT Frequency: 5x week OT Frequency: 5x week            Contractures Contractures Info: Not present    Additional Factors Info  Code Status, Allergies Code Status Info: Full Allergies Info: NKA           Current Medications (08/27/2020):  This is the current hospital active medication list Current Facility-Administered Medications  Medication Dose Route Frequency Provider Last Rate Last Admin   amLODipine (NORVASC) tablet 5 mg  5 mg Oral Daily 08/29/2020, MD   5 mg at 08/27/20 1050   clopidogrel (PLAVIX) tablet 75 mg  75 mg Oral Daily 08/29/20, MD   75 mg at 08/27/20 1046   enoxaparin (LOVENOX) injection 40 mg  40 mg Subcutaneous Q24H 08/29/20, MD   40 mg at 08/27/20 1325   lacosamide (VIMPAT) 200 mg in sodium chloride 0.9 % 25 mL IVPB  200 mg Intravenous Q12H 08/29/20, MD 90 mL/hr at 08/27/20 1115 200 mg at 08/27/20 1115   levETIRAcetam (KEPPRA) IVPB 1500 mg/ 100 mL premix  1,500 mg Intravenous Q12H 08/29/20, MD 400 mL/hr at 08/27/20 0902 1,500 mg at 08/27/20 0902   LORazepam (ATIVAN) injection 2 mg  2 mg Intravenous Q2H PRN 08/29/20, MD   2 mg at 08/26/20 1754   phenytoin (DILANTIN) injection 100 mg  100 mg Intravenous Q8H Pham, Minh Q, RPH-CPP   100 mg at 08/27/20 1325   polyethylene  glycol (MIRALAX / GLYCOLAX) packet 17 g  17 g Oral Daily PRN Theotis Barrio, MD       QUEtiapine (SEROQUEL) tablet 25 mg  25 mg Oral QHS PRN Karsten Ro, MD   25 mg at 08/26/20 2212   rosuvastatin (CRESTOR) tablet 20 mg  20 mg Oral QHS Merrilyn Puma, MD   20 mg at 08/26/20 2212     Discharge Medications: Please see discharge summary for a list of discharge medications.  Relevant Imaging Results:  Relevant Lab Results:   Additional Information SS# [id number]  Carley Hammed, LCSWA

## 2020-08-27 NOTE — Plan of Care (Signed)
  Problem: Education: Goal: Knowledge of disease or condition will improve Outcome: Progressing Goal: Knowledge of secondary prevention will improve Outcome: Progressing Goal: Knowledge of patient specific risk factors addressed and post discharge goals established will improve Outcome: Progressing Goal: Individualized Educational Video(s) Outcome: Progressing   Problem: Coping: Goal: Will verbalize positive feelings about self Outcome: Progressing Goal: Will identify appropriate support needs Outcome: Progressing   Problem: Health Behavior/Discharge Planning: Goal: Ability to manage health-related needs will improve Outcome: Progressing   Problem: Self-Care: Goal: Ability to participate in self-care as condition permits will improve Outcome: Progressing   Problem: Nutrition: Goal: Risk of aspiration will decrease Outcome: Progressing

## 2020-08-27 NOTE — Progress Notes (Addendum)
   Subjective:  Casey Edwards is comfortably sleeping in bed with continuous EEG monitoring ongoing.  She is arousable to light voice but did not verbally answer questions.  Does nod her head when asked if she is doing fine and if I could perform a physical exam.  Objective:  Vital signs in last 24 hours: Vitals:   08/26/20 1935 08/26/20 2336 08/27/20 0336 08/27/20 0726  BP: (!) 150/85 (!) 141/91 124/74 (!) 128/98  Pulse: 88 87 88 87  Resp: 19 (!) 22 (!) 21 18  Temp: 98.4 F (36.9 C) 99.3 F (37.4 C) 99.8 F (37.7 C) 99.5 F (37.5 C)  TempSrc: Axillary Oral Oral Oral  SpO2: 94% 97% 97% 97%  Weight:      Height:       Physical exam: General: Well-nourished, chronically ill-appearing elderly female, lying in bed, NAD. Pulm: Normal work of breathing.  No respiratory distress noted. CV: Normal rate and regular rhythm, no murmurs rubs or gallops. Neuro: Alert.  Nods appropriately to questions.  Able to move all extremities. Skin: Warm and dry.  Assessment/Plan:  Principal Problem:   Encephalopathy Active Problems:   Aphasia as late effect of cerebrovascular accident   Dementia (HCC)   Right hemiparesis (HCC)   Seizure disorder (HCC)   CVA (cerebral vascular accident) (HCC)  Breakthrough Seizures History of Seizure Disorder Patient has a known history of seizure disorder secondary to previous CVA for which she was treated with low doses of Keppra and phenytoin prior to admission. Patient's encephalopathy on admission likely 2/2 postictal state given repeat seizure activity noted on cEEG. Neurology following, greatly appreciate assistance. -Continuous EEG showing evidence of epileptogenicity arising from the right frontotemporal region and evidence of independent cortical dysfunction arising from left and right hemispheres, but no definite seizure seen today -Keppra 1500mg  BID, Dilantin 100mg  TID, Vimpat 200mg  BID -Ativan as needed for seizure >5 minutes -PT/OT recommending  SNF -Dysphagia 1 diet per SLP  Hx of L hemispheric CVA w/ residual aphasia and right-sided weakness Home simvastatin was switched to Crestor given LDL above goal. -Continue home Plavix -Continue Crestor  Hypertension Blood pressures well controlled on home norvasc. -Continue home Norvasc 5 mg daily  Prior to Admission Living Arrangement: Assisted living facility Anticipated Discharge Location: TBD Barriers to Discharge: Continued medical management  Dispo: Anticipated discharge in approximately 2-3 day(s).   Dr. Internal Medicine PGY-1  Pager: [phone] After 5pm on weekdays and 1pm on weekends: On Call pager [phone]  08/27/2020, 8:17 AM

## 2020-08-27 NOTE — Procedures (Addendum)
Patient Name: Casey Edwards  MRN: [id number]  Epilepsy Attending: Charlsie Quest  Referring Physician/Provider: Lanae Boast, NP Duration: 08/26/2020 1037 to 08/27/2020 1037   Patient history: 65 year old female with history of stroke and residual right-sided weakness,aphasia found to be acutely altered with right gaze preference.  EEG to evaluate for seizures.   Level of alertness: awake, asleep   AEDs during EEG study: LEV, PHT, LCM   Technical aspects: This EEG study was done with scalp electrodes positioned according to the 10-20 International system of electrode placement. Electrical activity was acquired at a sampling rate of 500Hz  and reviewed with a high frequency filter of 70Hz  and a low frequency filter of 1Hz . EEG data were recorded continuously and digitally stored.   Description: During awake state, no clear posterior dominant rhythm was seen. Sleep was characterized by sleep spindles (12-14hz ), maximal frontocentral region. EEG also showed continuous generalized and lateralized left hemisphere 3-5hz  theta-delta slowing. Abundant sharp waves were noted in right frontotemporal region which at times appear quali- PLED like at 0.25-1Hz .    ABNORMALITY - Sharp waves, right frontotemporal region. - Continuous slow, generalized and lateralized left hemisphere    IMPRESSION: This study showed evidence of epileptogenicity arising from right frontotemporal region. Additionally there is evidence of independent cortical dysfunction arising from left and right hemisphere hemisphere likely secondary to underlying stroke and seizure respectively. No definite seizures were seen during this study.   EEG continues to be improving compared to previous day.   Carley Strickling 

## 2020-08-27 NOTE — Progress Notes (Signed)
vLTM EEG complete. No skin breakdown 

## 2020-08-27 NOTE — Progress Notes (Signed)
NEUROLOGY CONSULTATION PROGRESS NOTE   Date of service: August 27, 2020 Patient Name: Casey Edwards MRN:  [id number] DOB:  [birth year]  Brief HPI  GABBIE MARZO is a 65 y.o. 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 Likely seizure, improved with Keppra 1500mg  IV once.  cEEG with seizures, increaed AEDs and now on Keppra 1500mg  BID, Vimpat 200mg  BID and Phenytoin 100mg  TID. On 5/25, cEEG improved to right sided PLEDs and left cortical dysfunction.   Interval Hx   cEEG with no seizures for abour 24 hours now.   Vitals   Vitals:   08/26/20 2336 08/27/20 0336 08/27/20 0726 08/27/20 1116  BP: (!) 141/91 124/74 (!) 128/98 125/88  Pulse: 87 88 87 87  Resp: (!) 22 (!) 21 18 20   Temp: 99.3 F (37.4 C) 99.8 F (37.7 C) 99.5 F (37.5 C) 99.6 F (37.6 C)  TempSrc: Oral Oral Oral Oral  SpO2: 97% 97% 97% 100%  Weight:      Height:         Body mass index is 23.74 kg/m.  Physical Exam   General: Laying comfortably in bed; in no acute distress.  HENT: Normal oropharynx and mucosa. Normal external appearance of ears and nose.  Neck: Supple, no pain or tenderness  CV: No JVD. No peripheral edema.  Pulmonary: Symmetric Chest rise. Normal respiratory effort.  Abdomen: Soft to touch, non-tender.  Ext: No cyanosis, edema, or deformity  Skin: No rash. Normal palpation of skin.   Musculoskeletal: Normal digits and nails by inspection. No clubbing.   Neurologic Examination  Mental status/Cognition: eyes open, smiles. Awake. Speech/language: Vocalizing a lot more and smiling, says "yes". Follows 1 step commands. Cranial nerves:   CN II Pupils equal and reactive to light, no VF deficits   CN III,IV,VI EOM intact, no gaze preference or deviation    CN V    CN VII no asymmetry, no nasolabial fold flattening   CN VIII Turns head to speech   CN IX & X    CN XI    CN XII midline tongue protrusion   Motor:  Muscle bulk: poor, tone normal. Can lift BL arms up off the bed. Can wiggle toes BL on command.  Sensation:  Light touch Decreased in RUE and RLE.   Pin prick    Temperature    Vibration   Proprioception    Coordination/Complex Motor:  No obvious ataxia in BL upper extremities but unable to test it well as patient is in mitts due to pulling on EEG leads.  Labs   Basic Metabolic Panel:  Lab Results  Component Value Date   NA 137 08/26/2020   K 3.8 08/26/2020   CO2 24 08/26/2020   GLUCOSE 127 (H) 08/26/2020   BUN 6 (L) 08/26/2020   CREATININE 0.54 08/26/2020   CALCIUM 10.0 08/26/2020   GFRNONAA >60 08/26/2020   GFRAA >60 04/20/2019   HbA1c:  Lab Results  Component Value Date   HGBA1C 5.0 08/25/2020   LDL:  Lab Results  Component Value Date   LDLCALC 102 (H) 08/25/2020   Urine Drug Screen: No results found for: LABOPIA, COCAINSCRNUR, LABBENZ, AMPHETMU, THCU, LABBARB  Alcohol Level     Component Value Date/Time   ETH <10 08/24/2020  0840   Lab Results  Component Value Date   PHENYTOIN 13.0 08/26/2020   Lab Results  Component Value Date   PHENYTOIN 13.0 08/26/2020    Imaging and Diagnostic studies  Results for orders placed during the hospital encounter of 08/24/20  MR BRAIN WO CONTRAST(personally reviewed) No acute infarction. Motion degraded.  cEEG: This study showed evidence of epileptogenicity arising from right frontotemporal region. Additionally there is evidence of independent cortical dysfunction arising from left and right hemisphere hemisphere likely secondary to underlying stroke and seizure respectively. No definite seizures were seen during this study.   EEG continues to be improving compared to previous day.  Impression   TOPEKA GIAMMONA is a 65 y.o. female admitted with a  prolonged seizure and noted to have R gaze deviation with stiffness in RUE and RLE on presentation. She is on subtherapeutic dose of Keppra at home(125mg  BID) and low dose of Phenytoin 130mg  BID.  MRI Brain negative for an acute stroke. cEEG initially with brief 4-5 seizures per hour which improved to PLEDs on Keppra 1500mg  BID, Dilantin 100mg  TID and Vimpat 200mg  BID.  Recommendations  - Discontinued cEEG today - continue PO Keppra 1500mg  BID - continue PO Phenytoin 100mg  TID. - continue PO Vimpat 200mg  BID - Seizure precautions and Ativan 2mg  for seizure lasting more than 5 mins. - Follow up with her outpatient neurologist in 1-2 weeks. - Neurology will signoff. Please feel free to contact with any questions or concerns.  ______________________________________________________________________   Thank you for the opportunity to take part in the care of this patient. If you have any further questions, please contact the neurology consultation attending.  Signed,  [facility] Pager Number 

## 2020-08-28 LAB — GLUCOSE, CAPILLARY: Glucose-Capillary: 158 mg/dL — ABNORMAL HIGH (ref 70–99)

## 2020-08-28 NOTE — TOC Initial Note (Signed)
Transition of Care [facility]) - Initial/Assessment Note    Patient Details  Name: Casey Edwards MRN: [id number] Date of Birth: 01-10-1956  [facility]) CM/SW Contact:    Baldemar Lenis, LCSW Phone Number: 08/28/2020, 4:01 PM  Clinical Narrative:       CSW spoke with Christian at [facility] to ask about patient's baseline. Patient is able to ambulate to bathroom in her apartment, but will use a wheelchair for longer distances (to the dining room). She is min assist for transfers. Patient is able to feed herself and go to the bathroom, but staff sometimes assists with clean up. Staff assist with dressing and bathing. CSW discussed with RN, and patient is not yet back to baseline at this time. CSW spoke with patient's brother, who agreed that patient is not back to baseline. Brother asked if PT would work with the patient to see if she could walk, and PT to see today if time or tomorrow. Brother agreeable to SNF.  CSW provided bed offers and brother chose [facility]. CSW reached out to [facility] and awaiting them to respond on bed availability. CSW to follow.            Expected Discharge Plan: Skilled Nursing Facility Barriers to Discharge: Continued Medical Work up   Patient Goals and CMS Choice Patient states their goals for this hospitalization and ongoing recovery are:: patient unable to participate in goal setting, not fully oriented CMS Medicare.gov Compare Post Acute Care list provided to:: Patient Represenative (must comment) Choice offered to / list presented to : Sibling  Expected Discharge Plan and Services Expected Discharge Plan: Skilled Nursing Facility     Post Acute Care Choice: Skilled Nursing Facility Living arrangements for the past 2 months: Assisted Living Facility                                      Prior Living Arrangements/Services Living arrangements for the past 2 months: Assisted Living Facility Lives with:: Facility  Resident Patient language and need for interpreter reviewed:: No Do you feel safe going back to the place where you live?: Yes      Need for Family Participation in Patient Care: Yes (Comment) Care giver support system in place?: No (comment) Current home services: DME Criminal Activity/Legal Involvement Pertinent to Current Situation/Hospitalization: No - Comment as needed  Activities of Daily Living      Permission Sought/Granted Permission sought to share information with : Facility Medical sales representative, Family Supports Permission granted to share information with : Yes, Verbal Permission Granted  Share Information with NAME: Arthenus  Permission granted to share info w AGENCY: SNF  Permission granted to share info w Relationship: Brother     Emotional Assessment   Attitude/Demeanor/Rapport: Unable to Assess Affect (typically observed): Unable to Assess Orientation: : Oriented to Self Alcohol / Substance Use: Not Applicable Psych Involvement: No (comment)  Admission diagnosis:  CVA (cerebral vascular accident) [facility]) [I63.9] Patient Active Problem List   Diagnosis Date Noted   Anemia of chronic disease 08/25/2020   Aphasia as late effect of cerebrovascular accident 08/25/2020   Dementia (HCC) 08/25/2020   Right hemiparesis (HCC) 08/25/2020   Encephalopathy 08/25/2020   Seizure disorder (HCC) 08/25/2020   CVA (cerebral vascular accident) (HCC) 08/25/2020   Essential hypertension 07/22/2018   History of stroke 07/22/2018   Major depressive disorder 07/22/2018   Other hyperlipidemia 07/22/2018   PCP:  Clinic, [facility] Pharmacy:  No Pharmacies Listed    Social Determinants of Health (SDOH) Interventions    Readmission Risk Interventions No flowsheet data found.

## 2020-08-28 NOTE — Progress Notes (Signed)
   Subjective:  Ms. Vernet is lying in bed comfortably.  Her responses to any questions are repetitive, states "thank you" to every question.  Per [facility] at [facility], patient typically responds with one-word responses.  It is likely that she is at her baseline.  At this time she is stable for discharge.  Objective:  Vital signs in last 24 hours: Vitals:   08/27/20 2340 08/28/20 0738 08/28/20 1010 08/28/20 1113  BP: (!) 147/89 111/82 135/86 119/85  Pulse: 96 (!) 103  (!) 101  Resp: 19 18  20   Temp: 98.4 F (36.9 C) 98.6 F (37 C)  98.4 F (36.9 C)  TempSrc: Axillary     SpO2: 98% 99%  98%  Weight:      Height:       Physical exam: General: Well-nourished, chronically ill-appearing elderly female, lying in bed, NAD. Pulm: Normal work of breathing, no respiratory distress noted. CV: Normal rate and regular rhythm, no murmurs rubs or gallops. Neuro: Awake and alert, answers with "thank you" to all questions.  Moves all extremities. Skin: Warm and dry.  Assessment/Plan:  Principal Problem:   Encephalopathy Active Problems:   Aphasia as late effect of cerebrovascular accident   Dementia (HCC)   Right hemiparesis (HCC)   Seizure disorder (HCC)   CVA (cerebral vascular accident) (HCC)  Breakthrough Seizures History of Seizure Disorder Patient's encephalopathy on admission likely secondary to postictal state given repeat seizure activity noted on continuous EEG.  Neurology has signed off and have recommended to continue patient on Keppra 1500 mg twice daily, Dilantin 100 mg 3 times daily, Vimpat 200 mg twice daily at discharge.  Recommending outpatient neurology follow-up in 1 to 2 weeks.  Patient is medically stable for discharge.  PT/OT recommending SNF placement.  Transitions of care has been consulted, appreciate assistance.  Hx of L hemispheric CVA w/ residual aphasia and right-sided weakness -Continue home Plavix -Continue Crestor  Hypertension Blood  pressures well controlled on home Norvasc -Continue Norvasc  Prior to Admission Living Arrangement: Assisted living facility Anticipated Discharge Location: SNF Barriers to Discharge: Pending SNF placement  Dispo: Anticipated discharge in approximately 1-2 day(s).   Dr. Internal Medicine PGY-1  Pager: [phone] After 5pm on weekdays and 1pm on weekends: On Call pager [phone]  08/28/2020, 2:12 PM

## 2020-08-28 NOTE — Plan of Care (Signed)
  Problem: Education: Goal: Knowledge of disease or condition will improve Outcome: Not Progressing Goal: Knowledge of secondary prevention will improve Outcome: Not Progressing Goal: Knowledge of patient specific risk factors addressed and post discharge goals established will improve Outcome: Not Progressing Goal: Individualized Educational Video(s) Outcome: Not Progressing   Problem: Coping: Goal: Will verbalize positive feelings about self Outcome: Not Progressing Goal: Will identify appropriate support needs Outcome: Not Progressing   Problem: Health Behavior/Discharge Planning: Goal: Ability to manage health-related needs will improve Outcome: Not Progressing   Problem: Self-Care: Goal: Ability to participate in self-care as condition permits will improve Outcome: Not Progressing   Problem: Nutrition: Goal: Risk of aspiration will decrease Outcome: Not Progressing

## 2020-08-28 NOTE — Procedures (Signed)
Patient Name: Casey Edwards  MRN: [id number]  Epilepsy Attending: Charlsie Quest  Referring Physician/Provider: Lanae Boast, NP Duration: 08/27/2020 1037 to 08/28/2020 1542   Patient history: 65 year old female with history of stroke and residual right-sided weakness,aphasia found to be acutely altered with right gaze preference.  EEG to evaluate for seizures.   Level of alertness: awake, asleep   AEDs during EEG study: LEV, PHT, LCM   Technical aspects: This EEG study was done with scalp electrodes positioned according to the 10-20 International system of electrode placement. Electrical activity was acquired at a sampling rate of 500Hz  and reviewed with a high frequency filter of 70Hz  and a low frequency filter of 1Hz . EEG data were recorded continuously and digitally stored.   Description: During awake state, no clear posterior dominant rhythm was seen. Sleep was characterized by sleep spindles (12-14hz ), maximal frontocentral region. EEG also showed continuous generalized and lateralized left hemisphere 3-5hz  theta-delta slowing. Abundant sharp waves were noted in right frontotemporal region which at times appear quali- PLED like at 0.25-1Hz . At times spikes were also noted in left temporo-parietal region    ABNORMALITY - Sharp waves, right frontotemporal region - Spike, left temporo-parietal region - Continuous slow, generalized and lateralized left hemisphere    IMPRESSION: This study showed independent evidence of epileptogenicity arising from right frontotemporal region as well as left temporo-parietal region. Additionally there is evidence of independent cortical dysfunction arising from left and right hemisphere hemisphere likely secondary to underlying stroke and seizure respectively. No definite seizures were seen during this study.   [name] 

## 2020-08-28 NOTE — Progress Notes (Signed)
SLP Cancellation Note  Patient Details Name: Casey Edwards MRN: [id number] DOB: Aug 28, 1955   Cancelled treatment:       Reason Eval/Treat Not Completed: Patient at procedure or test/unavailable   Tressie Stalker, M.S., CCC-SLP 08/28/2020, 1:21 PM

## 2020-08-28 NOTE — Evaluation (Signed)
Occupational Therapy Evaluation Patient Details Name: Casey Edwards MRN: [id number] DOB: 09-08-1955 Today's Date: 08/28/2020    History of Present Illness 65 yo female presents to ED on 6/23 with AMS, R gaze. CTH negative for acute findings. EEG suggestive of moderate to severe diffuse encephalopathy. Workup for seizures, post-ictal state. 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   Clinical Impression   PTA, pt lived at an ALF and required assistance for BADLs. Currently, pt requiring max-total A +2 for LB ADLs. Pt performing face washing with min guard sitting EOB. Pt requiring max A +2 for bed mobility. Pt presenting with decreased strength, activity tolerance, sitting balance, expressive language, and cognition. Recommend discharge to SNF. Will continue to follow acutely to optimize independence in ADLs.     Follow Up Recommendations  SNF    Equipment Recommendations  None recommended by OT    Recommendations for Other Services       Precautions / Restrictions Precautions Precautions: Fall Precaution Comments: L gaze preference Restrictions Weight Bearing Restrictions: No      Mobility Bed Mobility Overal bed mobility: Needs Assistance Bed Mobility: Supine to Sit;Sit to Supine Rolling: Max assist   Supine to sit: Max assist;+2 for physical assistance Sit to supine: Max assist;+2 for physical assistance   General bed mobility comments: max +2 for all aspects bed mobility, increased time. Pt with increased time during bed mobility but reporting it felt better sitting up.    Transfers Overall transfer level: Needs assistance Equipment used: 2 person hand held assist Transfers: Sit to/from Stand Sit to Stand: Max assist;+2 physical assistance         General transfer comment: max +2 for power up with bed pads, rise, LE blocking, and steadying    Balance Overall balance assessment:  Needs assistance Sitting-balance support: No upper extremity supported;Feet supported Sitting balance-Leahy Scale: Poor Sitting balance - Comments: initially requiring min posterior assist to maintain upright sitting, periods of supervision. L lateral leaning resistant to postural corrections to upright Postural control: Left lateral lean Standing balance support: During functional activity;Bilateral upper extremity supported Standing balance-Leahy Scale: Zero Standing balance comment: max +2 sit<>stand                           ADL either performed or assessed with clinical judgement   ADL Overall ADL's : Needs assistance/impaired Eating/Feeding: Sitting;Moderate assistance   Grooming: Wash/dry face;Min guard;Sitting Grooming Details (indicate cue type and reason): Pt washing face with min verbal cues to wash upper portion of face. Upper Body Bathing: Bed level;Moderate assistance;Cueing for sequencing   Lower Body Bathing: Total assistance;Bed level;+2 for physical assistance;+2 for safety/equipment   Upper Body Dressing : Sitting;Maximal assistance;Cueing for sequencing   Lower Body Dressing: Total assistance;Bed level;+2 for physical assistance;+2 for safety/equipment   Toilet Transfer: Maximal assistance;+2 for physical assistance;+2 for safety/equipment   Toileting- Clothing Manipulation and Hygiene: +2 for safety/equipment;+2 for physical assistance;Total assistance;Bed level       Functional mobility during ADLs: Maximal assistance;+2 for physical assistance;+2 for safety/equipment General ADL Comments: Pt requiring max-total A for LB ADLs and transfers. Pt able to follow commands with verbal cues and increased time to wash face.     Vision Patient Visual Report: Peripheral vision impairment Alignment/Gaze Preference: Gaze left;Head turned Additional Comments: difficult to assess due to decreased cognition     Perception Perception Perception Tested?: No    Praxis  Praxis Praxis tested?: Not tested    Pertinent Vitals/Pain Pain Assessment: Faces Faces Pain Scale: Hurts little more Pain Location: grimacing with moving BLE to EOB Pain Descriptors / Indicators: Discomfort;Moaning;Grimacing Pain Intervention(s): Monitored during session;Limited activity within patient's tolerance;Repositioned     Hand Dominance     Extremity/Trunk Assessment Upper Extremity Assessment Upper Extremity Assessment: RUE deficits/detail RUE Deficits / Details: increased tone in elbow extension PROM. Able to actively flex at elbow to lift RUE briefly. RUE Coordination: decreased fine motor;decreased gross motor   Lower Extremity Assessment Lower Extremity Assessment: Defer to PT evaluation   Cervical / Trunk Assessment Cervical / Trunk Assessment: Other exceptions Cervical / Trunk Exceptions: prefernce for L lateral leaning   Communication Communication Communication: Expressive difficulties   Cognition Arousal/Alertness: Awake/alert Behavior During Therapy: WFL for tasks assessed/performed Overall Cognitive Status: History of cognitive impairments - at baseline                                 General Comments: following one step commands with increased time. Requiring verbal cues to wash face, initially only wiping mouth. Responding "thank you" and "oh lord" to nearly every question or command. Pt reporting she "lives here".   General Comments  Pt pleasant throughout. Requiring cues to keep eyes opened.    Exercises     Shoulder Instructions      Home Living Family/patient expects to be discharged to:: Assisted living Living Arrangements: Alone;Other (Comment) (staff assist)                           Home Equipment: Wheelchair - manual          Prior Functioning/Environment Level of Independence: Needs assistance  Gait / Transfers Assistance Needed: assist with transfers and likely wheelchair level ADL's /  Homemaking Assistance Needed: Assist as needed for all ADL            OT Problem List: Decreased strength;Decreased range of motion;Decreased activity tolerance;Impaired balance (sitting and/or standing);Impaired UE functional use;Decreased cognition;Impaired tone      OT Treatment/Interventions: Self-care/ADL training;Therapeutic activities;Cognitive remediation/compensation;Patient/family education;Manual therapy;Therapeutic exercise;DME and/or AE instruction    OT Goals(Current goals can be found in the care plan section) Acute Rehab OT Goals Patient Stated Goal: none stated OT Goal Formulation: Patient unable to participate in goal setting Time For Goal Achievement: 09/11/20 Potential to Achieve Goals: Fair  OT Frequency: Min 1X/week   Barriers to D/C:            Co-evaluation              AM-PAC OT "6 Clicks" Daily Activity     Outcome Measure Help from another person eating meals?: A Lot Help from another person taking care of personal grooming?: A Lot Help from another person toileting, which includes using toliet, bedpan, or urinal?: Total Help from another person bathing (including washing, rinsing, drying)?: A Lot Help from another person to put on and taking off regular upper body clothing?: A Lot Help from another person to put on and taking off regular lower body clothing?: Total 6 Click Score: 10   End of Session Equipment Utilized During Treatment: Gait belt Nurse Communication: Mobility status  Activity Tolerance: Patient tolerated treatment well Patient left: in bed;with call bell/phone within reach;with bed alarm set  OT Visit Diagnosis: Muscle weakness (generalized) (M62.81);Other symptoms and signs involving cognitive function;Hemiplegia and hemiparesis Hemiplegia -  Right/Left: Right Hemiplegia - caused by: Cerebral infarction                Time: 1610-9604 OT Time Calculation (min): 18 min Charges:  OT General Charges $OT Visit: 1 Visit OT  Evaluation $OT Eval Moderate Complexity: 1 [address], OTDS   Ladene Artist 08/28/2020, 12:55 PM

## 2020-08-29 ENCOUNTER — Inpatient Hospital Stay (HOSPITAL_COMMUNITY): Payer: No Typology Code available for payment source

## 2020-08-29 LAB — BASIC METABOLIC PANEL
Anion gap: 9 (ref 5–15)
BUN: 39 mg/dL — ABNORMAL HIGH (ref 8–23)
CO2: 27 mmol/L (ref 22–32)
Calcium: 9.8 mg/dL (ref 8.9–10.3)
Chloride: 97 mmol/L — ABNORMAL LOW (ref 98–111)
Creatinine, Ser: 1.69 mg/dL — ABNORMAL HIGH (ref 0.44–1.00)
GFR, Estimated: 33 mL/min — ABNORMAL LOW (ref >60)
Glucose, Bld: 125 mg/dL — ABNORMAL HIGH (ref 70–99)
Potassium: 5 mmol/L (ref 3.5–5.1)
Sodium: 133 mmol/L — ABNORMAL LOW (ref 135–145)

## 2020-08-29 LAB — URINALYSIS, COMPLETE (UACMP) WITH MICROSCOPIC
Bilirubin Urine: NEGATIVE
Glucose, UA: NEGATIVE mg/dL
Ketones, ur: NEGATIVE mg/dL
Leukocytes,Ua: NEGATIVE
Nitrite: NEGATIVE
Protein, ur: NEGATIVE mg/dL
RBC / HPF: >50 RBC/hpf — ABNORMAL HIGH (ref 0–5)
Specific Gravity, Urine: 1.012 (ref 1.005–1.030)
pH: 6 (ref 5.0–8.0)

## 2020-08-29 LAB — CBC WITH DIFFERENTIAL/PLATELET
Abs Immature Granulocytes: 0.12 10*3/uL — ABNORMAL HIGH (ref 0.00–0.07)
Basophils Absolute: 0.1 10*3/uL (ref 0.0–0.1)
Basophils Relative: 0 %
Eosinophils Absolute: 0.1 10*3/uL (ref 0.0–0.5)
Eosinophils Relative: 1 %
HCT: 38.4 % (ref 36.0–46.0)
Hemoglobin: 12.7 g/dL (ref 12.0–15.0)
Immature Granulocytes: 1 %
Lymphocytes Relative: 9 %
Lymphs Abs: 1.1 10*3/uL (ref 0.7–4.0)
MCH: 29.2 pg (ref 26.0–34.0)
MCHC: 33.1 g/dL (ref 30.0–36.0)
MCV: 88.3 fL (ref 80.0–100.0)
Monocytes Absolute: 1.6 10*3/uL — ABNORMAL HIGH (ref 0.1–1.0)
Monocytes Relative: 13 %
Neutro Abs: 9.3 10*3/uL — ABNORMAL HIGH (ref 1.7–7.7)
Neutrophils Relative %: 76 %
Platelets: 290 10*3/uL (ref 150–400)
RBC: 4.35 MIL/uL (ref 3.87–5.11)
RDW: 14.1 % (ref 11.5–15.5)
WBC: 12.3 10*3/uL — ABNORMAL HIGH (ref 4.0–10.5)
nRBC: 0 % (ref 0.0–0.2)

## 2020-08-29 LAB — SARS CORONAVIRUS 2 (TAT 6-24 HRS): SARS Coronavirus 2: NEGATIVE

## 2020-08-29 MED ORDER — DICLOFENAC SODIUM 1 % EX GEL
2.0000 g | Freq: Three times a day (TID) | CUTANEOUS | Status: DC
  Administered 2020-08-29 – 2020-08-30 (×5): 2 g via TOPICAL
  Filled 2020-08-29: qty 100

## 2020-08-29 MED ORDER — CHLORHEXIDINE GLUCONATE CLOTH 2 % EX PADS
6.0000 | MEDICATED_PAD | Freq: Every day | CUTANEOUS | Status: DC
  Administered 2020-08-29: 6 via TOPICAL

## 2020-08-29 NOTE — Progress Notes (Signed)
  Speech Language Pathology Treatment:    Patient Details Name: Casey Edwards MRN: [id number] DOB: 1956-02-07 Today's Date: 08/29/2020 Time: 0902-0922 SLP Time Calculation (min) (ACUTE ONLY): 20 min  Assessment / Plan / Recommendation Clinical Impression  Pt seen for skilled SLP to address dysphagia.  Today pt alert and accepting of intake including water via straw, yogurt and graham crackers.  Adequate mastication despite lack of dentition and pt takes her time to [facility] well.  No oral pocketing apparent.  Pt appears with 2 posterior red "spots" on the left ridge of gum and ? Bruising/abrasion on right posterior ridge of gum.  Pt denied pain/discomfort.  PT will benefit from set up assistance but can feed herself independently.  Given recent fevers, will follow up x1 to assure tolerance.  Educated pt to recommendations but uncertain to her comprehension given her aphasia.  Regardless, pt does not need full supervision.    HPI HPI: Ms.Doshi is a 65 yo F w/ PMH of Cva w/ residual aphasia, right sided weakness, seizure disorder, htn, hld presenting to [facility] with new onset left sided weakness and altered mental status likely 2/2 post-ictal state from breakthrough seizure vs acute CVA      SLP Plan  Continue with current plan of care       Recommendations  Diet recommendations: Dysphagia 3 (mechanical soft);Thin liquid Liquids provided via: Cup;Straw Medication Administration: Whole meds with puree Supervision: Patient able to self feed Compensations: Slow rate;Small sips/bites;Minimize environmental distractions;Lingual sweep for clearance of pocketing (assure right pocketing clear) Postural Changes and/or Swallow Maneuvers: Seated upright 90 degrees;Upright 30-60 min after meal                Oral Care Recommendations: Oral care BID Follow up Recommendations: Skilled Nursing facility SLP Visit Diagnosis: Dysphagia, oral phase (R13.11) Plan: Continue with current plan of  care       GO                Chales Abrahams 08/29/2020, 9:30 AM Rolena Infante, MS [facility] SLP Acute Rehab Services Office [phone] Pager [phone]

## 2020-08-29 NOTE — Progress Notes (Addendum)
   Subjective:  Patient did spike a low-grade fever overnight.  [name] is resting comfortably in bed, awake and alert.  States "no" when asking if in pain.  However she does grimace and jerk when lower extremities are moved.  Objective:  Vital signs in last 24 hours: Vitals:   08/28/20 1938 08/28/20 2339 08/29/20 0139 08/29/20 0330  BP: 118/76 122/73 (!) 140/92 127/80  Pulse: (!) 106 (!) 105 95   Resp: 20 20 19 20   Temp: 100.2 F (37.9 C) (!) 100.5 F (38.1 C) 99.2 F (37.3 C) 99 F (37.2 C)  TempSrc: Oral Oral Axillary Axillary  SpO2: 97% 98% 98% 97%  Weight:      Height:       Physical exam: General: Well-nourished, chronically ill-appearing elderly female, lying in bed, NAD. Pulm: Normal work of breathing, no respiratory distress noted. MSK: Pain with active movement of right knee and left knee, although significantly more pain with movement of left knee.  Knees are not tender to palpation.  Left knee with possible small effusion, but no overlying redness or erythema noted. Neuro: Awake and alert, still using one-word responses to questions. Skin: Warm and dry.  Assessment/Plan:  Principal Problem:   Encephalopathy Active Problems:   Aphasia as late effect of cerebrovascular accident   Dementia (HCC)   Right hemiparesis (HCC)   Seizure disorder (HCC)   CVA (cerebral vascular accident) (HCC)  Breakthrough Seizures History of Seizure Disorder Patient's encephalopathy on admission likely secondary to postictal state given repeat seizure activity noted on continuous EEG.  Neurology has signed off and have recommended to continue patient on Keppra 1500 mg twice daily, Dilantin 100 mg 3 times daily, Vimpat 200 mg twice daily at discharge.  Recommending outpatient neurology follow-up in 1 to 2 weeks.  Patient is medically stable for discharge.  PT/OT recommending SNF placement.  TOC following, appreciate assistance.  Fever She did spike a low-grade fever overnight,  unclear etiology.  She is unable to communicate properly and thus we are unable to elicit additional history in regards to fever.  Will obtain urinalysis to check for UTI. -Follow-up complete urinalysis and urine culture  Knee pain Patient with bilateral knee pain, L > R.  Knees are not hot to the touch and no overlying erythema noted so low suspicion for septic arthritis.  No pain at rest, but she does grimace with movement of knees.  Knee pain is likely secondary to osteoarthritis.  We will try Voltaren gel for relief. -Voltaren gel 3 times daily  Hx of L hemispheric CVA w/ residual aphasia and right-sided weakness -Continue home Plavix -Continue Crestor  Hypertension Blood pressures well controlled on home Norvasc -Continue Norvasc  Prior to Admission Living Arrangement: Assisted living facility Anticipated Discharge Location: SNF Barriers to Discharge: Pending SNF placement  Dispo: Anticipated discharge in approximately 1-2 day(s).   Dr. Internal Medicine PGY-1  Pager: [phone] After 5pm on weekdays and 1pm on weekends: On Call pager [phone]  08/29/2020, 10:41 AM

## 2020-08-29 NOTE — Plan of Care (Signed)
  Problem: Education: Goal: Knowledge of disease or condition will improve Outcome: Not Progressing Goal: Knowledge of secondary prevention will improve Outcome: Not Progressing Goal: Knowledge of patient specific risk factors addressed and post discharge goals established will improve Outcome: Not Progressing Goal: Individualized Educational Video(s) Outcome: Not Progressing   Problem: Coping: Goal: Will verbalize positive feelings about self Outcome: Not Progressing Goal: Will identify appropriate support needs Outcome: Not Progressing   Problem: Health Behavior/Discharge Planning: Goal: Ability to manage health-related needs will improve Outcome: Not Progressing   Problem: Self-Care: Goal: Ability to participate in self-care as condition permits will improve Outcome: Not Progressing   Problem: Nutrition: Goal: Risk of aspiration will decrease Outcome: Not Progressing   

## 2020-08-29 NOTE — Progress Notes (Signed)
Physical Therapy Treatment Patient Details Name: Casey Edwards MRN: [id number] DOB: 30-Nov-1955 Today's Date: 08/29/2020    History of Present Illness 65 yo female presents to ED on 6/23 with AMS, R gaze. CTH negative for acute findings. EEG suggestive of moderate to severe diffuse encephalopathy. Workup for seizures, post-ictal state. 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    PT Comments    Pt lethargic initially but became alert once sitting EOB. Pt expressed knee pain L>R with LE mobility, especially straightening of knees. Pt with heavy L lean within initial sitting but progressed to min A with work on R lean and wt shift. Stedy used for transfer to chair. Pt required max A +2 for sit>stand and had difficulty extending through hips. PT will continue to follow.    Follow Up Recommendations  SNF;Other (comment) (return to facility)     Equipment Recommendations  None recommended by PT    Recommendations for Other Services       Precautions / Restrictions Precautions Precautions: Fall Precaution Comments: L gaze preference Restrictions Weight Bearing Restrictions: No    Mobility  Bed Mobility Overal bed mobility: Needs Assistance Bed Mobility: Supine to Sit     Supine to sit: Max assist     General bed mobility comments: max A for LE's off EOB, pt able to assist with rolling to L rail and grasping rail to push to sitting, max A needed for trunk elevation to R. Pt with heavy left lean initially    Transfers Overall transfer level: Needs assistance Equipment used: Ambulation equipment used Transfers: Sit to/from [employer] Sit to Stand: Max assist;+2 physical assistance;From elevated surface Stand pivot transfers: Total assist;+2 physical assistance       General transfer comment: max +2 for power up with bed pads and pt tends to maintain trunk flexion making it  difficult to lower flaps of stedy, achieved after 3 trials of sit>stand  Ambulation/Gait             General Gait Details: unable to fully stand to be able to step feet   Stairs             Wheelchair Mobility    Modified Rankin (Stroke Patients Only) Modified Rankin (Stroke Patients Only) Pre-Morbid Rankin Score: Severe disability Modified Rankin: Severe disability     Balance Overall balance assessment: Needs assistance Sitting-balance support: No upper extremity supported;Feet supported Sitting balance-Leahy Scale: Poor Sitting balance - Comments: L lateral lean initially requiring mod A, worked on [employer] for 5 mins and pt progressed to min A with periods of min-guard Postural control: Left lateral lean Standing balance support: During functional activity;Bilateral upper extremity supported Standing balance-Leahy Scale: Zero Standing balance comment: max +2 needed                            Cognition Arousal/Alertness: Lethargic Behavior During Therapy: WFL for tasks assessed/performed Overall Cognitive Status: History of cognitive impairments - at baseline                                 General Comments: following one step commands with increased time. Verbalized only minimally.      Exercises      General Comments General comments (skin integrity, edema, etc.): VSS      Pertinent Vitals/Pain Pain Assessment: Faces  Faces Pain Scale: Hurts even more Pain Location: knees with movement, esp flexion to extension Pain Descriptors / Indicators: Discomfort;Moaning;Grimacing;Guarding Pain Intervention(s): Limited activity within patient's tolerance;Monitored during session    Home Living                      Prior Function            PT Goals (current goals can now be found in the care plan section) Acute Rehab PT Goals Patient Stated Goal: said "praise the Lord" when we told her we would help her get to the  chair PT Goal Formulation: With patient Time For Goal Achievement: 09/08/20 Potential to Achieve Goals: Fair Progress towards PT goals: Progressing toward goals    Frequency    Min 2X/week      PT Plan Current plan remains appropriate    Edwards-evaluation              AM-PAC PT "6 Clicks" Mobility   Outcome Measure  Help needed turning from your back to your side while in a flat bed without using bedrails?: A Lot Help needed moving from lying on your back to sitting on the side of a flat bed without using bedrails?: Total Help needed moving to and from a bed to a chair (including a wheelchair)?: Total Help needed standing up from a chair using your arms (e.g., wheelchair or bedside chair)?: Total Help needed to walk in hospital room?: Total Help needed climbing 3-5 steps with a railing? : Total 6 Click Score: 7    End of Session Equipment Utilized During Treatment: Gait belt Activity Tolerance: Patient tolerated treatment well Patient left: with call bell/phone within reach;in chair;with chair alarm set Nurse Communication: Mobility status;Need for lift equipment PT Visit Diagnosis: Other abnormalities of gait and mobility (R26.89);Hemiplegia and hemiparesis Hemiplegia - Right/Left: Right Hemiplegia - dominant/non-dominant: Dominant Hemiplegia - caused by: Cerebral infarction     Time: 1017-1040 PT Time Calculation (min) (ACUTE ONLY): 23 min  Charges:  $Therapeutic Activity: 23-37 mins                     Casey Edwards, PT  Acute Rehab Services  Pager [phone] Office [phone]    Casey Edwards 08/29/2020, 12:37 PM

## 2020-08-29 NOTE — Progress Notes (Addendum)
RN got orders to obtain urine culture this afternoon, equipment was changed in preparation for clean collection. By the evening, the patient did not have any urine in the collection container, only wet bed pads. Order from MD to bladder scan, which showed > 900 mL's retained.   MD was messaged, new order to in and out cath, 1,825 mL returned from in and out catheter, MD updated, new orders to insert foley catheter, which was done promptly. Multiple small blood clots mixed with the urine was noted after foley insertion. The MD was made aware and no new orders were given, RN to continue monitoring and update MD as needed. Night RN made aware. See flowsheets for additional information.   Urine culture sent to the lab.

## 2020-08-30 LAB — BASIC METABOLIC PANEL
Anion gap: 8 (ref 5–15)
BUN: 31 mg/dL — ABNORMAL HIGH (ref 8–23)
CO2: 25 mmol/L (ref 22–32)
Calcium: 9.7 mg/dL (ref 8.9–10.3)
Chloride: 104 mmol/L (ref 98–111)
Creatinine, Ser: 0.91 mg/dL (ref 0.44–1.00)
GFR, Estimated: >60 mL/min (ref >60)
Glucose, Bld: 122 mg/dL — ABNORMAL HIGH (ref 70–99)
Potassium: 4.5 mmol/L (ref 3.5–5.1)
Sodium: 137 mmol/L (ref 135–145)

## 2020-08-30 LAB — CBC WITH DIFFERENTIAL/PLATELET
Abs Immature Granulocytes: 0.18 10*3/uL — ABNORMAL HIGH (ref 0.00–0.07)
Basophils Absolute: 0 10*3/uL (ref 0.0–0.1)
Basophils Relative: 0 %
Eosinophils Absolute: 0.2 10*3/uL (ref 0.0–0.5)
Eosinophils Relative: 2 %
HCT: 33.9 % — ABNORMAL LOW (ref 36.0–46.0)
Hemoglobin: 11.5 g/dL — ABNORMAL LOW (ref 12.0–15.0)
Immature Granulocytes: 2 %
Lymphocytes Relative: 12 %
Lymphs Abs: 1.4 10*3/uL (ref 0.7–4.0)
MCH: 29.3 pg (ref 26.0–34.0)
MCHC: 33.9 g/dL (ref 30.0–36.0)
MCV: 86.5 fL (ref 80.0–100.0)
Monocytes Absolute: 2.2 10*3/uL — ABNORMAL HIGH (ref 0.1–1.0)
Monocytes Relative: 19 %
Neutro Abs: 7.3 10*3/uL (ref 1.7–7.7)
Neutrophils Relative %: 65 %
Platelets: 259 10*3/uL (ref 150–400)
RBC: 3.92 MIL/uL (ref 3.87–5.11)
RDW: 14 % (ref 11.5–15.5)
WBC: 11.3 10*3/uL — ABNORMAL HIGH (ref 4.0–10.5)
nRBC: 0 % (ref 0.0–0.2)

## 2020-08-30 MED ORDER — BACLOFEN 10 MG PO TABS: 5.0000 mg | ORAL_TABLET | Freq: Two times a day (BID) | ORAL | 0 refills | Status: AC | PRN

## 2020-08-30 MED ORDER — SULFAMETHOXAZOLE-TRIMETHOPRIM 800-160 MG PO TABS: 1.0000 | ORAL_TABLET | Freq: Two times a day (BID) | ORAL | 0 refills | Status: DC

## 2020-08-30 MED ORDER — LACOSAMIDE 200 MG PO TABS: 200.0000 mg | ORAL_TABLET | Freq: Two times a day (BID) | ORAL | 1 refills | Status: DC

## 2020-08-30 MED ORDER — SULFAMETHOXAZOLE-TRIMETHOPRIM 800-160 MG PO TABS
1.0000 | ORAL_TABLET | Freq: Two times a day (BID) | ORAL | Status: DC
  Administered 2020-08-30: 1 via ORAL
  Filled 2020-08-30: qty 1

## 2020-08-30 MED ORDER — LEVETIRACETAM 750 MG PO TABS: 1500.0000 mg | ORAL_TABLET | Freq: Two times a day (BID) | ORAL | 1 refills | Status: AC

## 2020-08-30 MED ORDER — DICLOFENAC SODIUM 1 % EX GEL: 4.0000 g | Freq: Three times a day (TID) | CUTANEOUS | 1 refills | Status: AC

## 2020-08-30 MED ORDER — LACOSAMIDE 200 MG PO TABS: 200.0000 mg | ORAL_TABLET | Freq: Two times a day (BID) | ORAL | 1 refills | Status: AC

## 2020-08-30 MED ORDER — PHENYTOIN SODIUM EXTENDED 100 MG PO CAPS: 100.0000 mg | ORAL_CAPSULE | Freq: Three times a day (TID) | ORAL | 1 refills | Status: DC

## 2020-08-30 MED ORDER — ROSUVASTATIN CALCIUM 20 MG PO TABS: 20.0000 mg | ORAL_TABLET | Freq: Every day | ORAL | 2 refills | Status: AC

## 2020-08-30 NOTE — Progress Notes (Signed)
Transfer report given to receiving facility RN, Monet Cruc. Pt is currently waiting transport by [facility].

## 2020-08-30 NOTE — Discharge Summary (Signed)
Name: NATILIE KRABBENHOFT MRN: [id number] DOB: [birth year] 65 y.o. PCP: [facility]  Date of Admission: 08/24/2020  8:50 AM Date of Discharge: 08/30/2020 Attending Physician: Tyson Alias, *  Discharge Diagnosis: 1.  Breakthrough seizures  2.  Acute simple cystitis  3.  Acute urinary retention  Discharge Medications: Allergies as of 08/30/2020   No Known Allergies      Medication List     STOP taking these medications    simvastatin 20 MG tablet Commonly known as: ZOCOR       TAKE these medications    acetaminophen 500 MG tablet Commonly known as: TYLENOL Take 1,000 mg by mouth daily.   amLODipine 5 MG tablet Commonly known as: NORVASC Take 5 mg by mouth daily.   baclofen 10 MG tablet Commonly known as: LIORESAL Take 0.5 tablets (5 mg total) by mouth 2 (two) times daily as needed for muscle spasms. What changed:  when to take this reasons to take this   clopidogrel 75 MG tablet Commonly known as: PLAVIX Take 75 mg by mouth daily.   diclofenac Sodium 1 % Gel Commonly known as: VOLTAREN Apply 4 g topically 3 (three) times daily. Apply topically to bilateral knees.   Ensure Plus Liqd Take 237 mLs by mouth 2 (two) times daily. Strawberry flavor   ferrous sulfate 325 (65 FE) MG tablet Take 325 mg by mouth daily.   Fish Oil 1000 MG Caps Take 2,000 mg by mouth 2 (two) times daily.   lacosamide 200 MG Tabs tablet Commonly known as: VIMPAT Take 1 tablet (200 mg total) by mouth 2 (two) times daily.   levETIRAcetam 750 MG tablet Commonly known as: KEPPRA Take 2 tablets (1,500 mg total) by mouth 2 (two) times daily. What changed:  medication strength how much to take   multivitamin with minerals tablet Take 1 tablet by mouth daily.   phenytoin 100 MG ER capsule Commonly known as: DILANTIN Take 1 capsule (100 mg total) by mouth 3 (three) times daily. What changed:  when to take this additional instructions Another medication  with the same name was removed. Continue taking this medication, and follow the directions you see here.   rosuvastatin 20 MG tablet Commonly known as: CRESTOR Take 1 tablet (20 mg total) by mouth at bedtime.   sertraline 100 MG tablet Commonly known as: ZOLOFT Take 50 mg by mouth daily.   sulfamethoxazole-trimethoprim 800-160 MG tablet Commonly known as: BACTRIM DS Take 1 tablet by mouth every 12 (twelve) hours.   Vitamin D3 50 MCG (2000 UT) Tabs Take 2,000 Units by mouth daily.        Disposition and follow-up:   Ms.Ludie H Vessell was discharged from [facility] in [place] condition.  At the hospital follow up visit please address:  1.  Breakthrough seizures.  Discharge with Keppra 1500 mg twice daily, Dilantin 100 mg 3 times daily, Vimpat 200 mg twice daily.  Will need outpatient neurology follow-up in 1 to 2 weeks (will need to make an appointment with her neurologist).  She will be going to  farm today for subacute rehabilitation.  2.  Acute simple cystitis.  Discharged with Bactrim DS twice daily for 3 days.  3.  Acute urinary retention.  Unclear etiology.  Likely has bladder stretch injury and thus Foley catheter in place for decompression.  She will need outpatient follow-up with urology in 1 month for Foley removal and trial of void.  2.  Labs / imaging needed  at time of follow-up: CMP, CBC  3.  Pending labs/ test needing follow-up: urine culture  Follow-up Appointments:   Hospital Course by problem list: 1.  Breakthrough seizures.  Patient admitted to [facility] for evaluation of acutely worsened mental status and found to have likely acute metabolic encephalopathy related to recent seizure activity.  Patient initially very confused due to encephalopathy and was unable to follow any commands.  CT head with no acute intracranial abnormalities.  MRI brain showed chronic ischemic disease but no acute infarctions.  Labs were otherwise reassuring with no  electrolyte abnormalities and no signs of infection.  The only centrally acting medications patient took at home were sertraline and baclofen which were held on admission.  Of note, patient does have a history of seizures and was previously on Keppra 250 mg daily and Dilantin 130 mg twice daily.  Neurology was consulted, believe that patient's altered mental status is likely secondary to postictal state from breakthrough seizure.  Patient was loaded with Keppra and then started on Keppra 500 mg twice daily and phenytoin 130 mg twice daily.  Continuous EEG was initiated.  Seizure precautions and Ativan 2 mg for seizures lasting more than 5 minutes were implemented as well.  Continuous EEG the following day showed seizures arising from the right frontotemporal region with an average of about 4-5 seizures per hour and lasting about 1 minute each.  Patient's antiepileptics were adjusted to as follows: Keppra increased to 1500 mg twice daily, Dilantin increased to 100 mg 3 times daily, and addition of Vimpat 200 mg twice daily.  Neurology recommended continuing these antiepileptics at discharge and following up with outpatient neurologist in 1 to 2 weeks.  She did not have any additional seizure activity while admitted.  PT/OT recommended SNF placement for subacute rehabilitation.  She will be discharged today with Keppra, Vimpat, Dilantin with the doses mentioned above, with plan for outpatient follow-up with neurology in 1 to 2 weeks (will need to make appointment with her neurologist). Discharging to [facility] today for subacute rehabilitation.  2.  Acute simple cystitis.  Patient developed a fever overnight yesterday.  She was found to have acute urinary retention which predisposes her to urinary stasis and likely infection.  Urinalysis showed 21-50 WBCs and rare bacteria.  Given inability to describe any urinary symptoms due to aphasia along with the above findings and because infection would lower patient's  seizure threshold, decided to treat with 3-day course of Bactrim DS.  Will need to follow-up urine cultures.  3.  Acute urinary retention.  Overnight yesterday, patient developed a fever which subsided spontaneously.  Due to concern for UTI, urine specimen needed to be obtained.  Patient's purewick was removed to obtain a clean catch sample.  However, after multiple hours she was unable to produce any urine.  Bladder scan showed greater than 900 mL of urine.  In and out cath was performed, removing about 1.8 L of urine.  Etiology of acute urinary retention is unclear at this time.  Spoke with neurology, patient's antiepileptic medications have not had a clear link to urinary retention and thus are likely not the cause.  However due to concern for bladder stretch injury, Foley catheter was placed for decompression.  Foley catheter will need to stay in place for about 1 month to allow for the bladder to heal.  She will need outpatient follow-up with urology in 1 month for Foley catheter removal and a trial of void.   Subjective: Patient's brother  is at bedside. We discussed over night events including the urinary retention and UTI. He expressed understanding.   Ms. Francesco SorLincoln continues to have knee pain today.   Discharge Exam:   BP 109/65 (BP Location: Right Arm)   Pulse 80   Temp 98 F (36.7 C) (Oral)   Resp 18   Ht 5\' 3"  (1.6 m)   Wt 60.8 kg   SpO2 100%   BMI 23.74 kg/m  Discharge exam:  General: Well-nourished, chronically ill-appearing elderly female, lying in bed, NAD. Pulm: Normal work of breathing, no respiratory distress noted. MSK: Tenderness to palpation in left and right knees.  Possible small effusion in left knee, but no overlying redness or erythema. Neuro: Awake and alert, appropriately nodding her head and using one-word responses to questions.  Skin: Warm and dry.   Pertinent Labs, Studies, and Procedures:  CMP Latest Ref Rng & Units 08/30/2020 08/29/2020 08/26/2020  Glucose  70 - 99 mg/dL 161(W122(H) 960(A125(H) 540(J127(H)  BUN 8 - 23 mg/dL 81(X31(H) 91(Y39(H) 6(L)  Creatinine 0.44 - 1.00 mg/dL 7.820.91 9.56(O1.69(H) 1.300.54  Sodium 135 - 145 mmol/L 137 133(L) 137  Potassium 3.5 - 5.1 mmol/L 4.5 5.0 3.8  Chloride 98 - 111 mmol/L 104 97(L) 103  CO2 22 - 32 mmol/L 25 27 24   Calcium 8.9 - 10.3 mg/dL 9.7 9.8 86.510.0  Total Protein 6.5 - 8.1 g/dL - - -  Total Bilirubin 0.3 - 1.2 mg/dL - - -  Alkaline Phos 38 - 126 U/L - - -  AST 15 - 41 U/L - - -  ALT 0 - 44 U/L - - -   CBC Latest Ref Rng & Units 08/30/2020 08/29/2020 08/26/2020  WBC 4.0 - 10.5 K/uL 11.3(H) 12.3(H) 10.1  Hemoglobin 12.0 - 15.0 g/dL 11.5(L) 12.7 12.0  Hematocrit 36.0 - 46.0 % 33.9(L) 38.4 35.8(L)  Platelets 150 - 400 K/uL 259 290 277   Urinalysis    Component Value Date/Time   COLORURINE YELLOW 08/29/2020 1941   APPEARANCEUR CLOUDY (A) 08/29/2020 1941   LABSPEC 1.012 08/29/2020 1941   PHURINE 6.0 08/29/2020 1941   GLUCOSEU NEGATIVE 08/29/2020 1941   HGBUR LARGE (A) 08/29/2020 1941   BILIRUBINUR NEGATIVE 08/29/2020 1941   KETONESUR NEGATIVE 08/29/2020 1941   PROTEINUR NEGATIVE 08/29/2020 1941   NITRITE NEGATIVE 08/29/2020 1941   LEUKOCYTESUR NEGATIVE 08/29/2020 1941   Lab Results  Component Value Date   PHENYTFREE None Detected 08/24/2020   PHENYTOIN 13.0 08/26/2020   PHENYTOIN 7.3 (L) 08/25/2020   Lipid Panel     Component Value Date/Time   CHOL 205 (H) 08/25/2020 0604   TRIG 30 08/25/2020 0604   HDL 97 08/25/2020 0604   CHOLHDL 2.1 08/25/2020 0604   VLDL 6 08/25/2020 0604   LDLCALC 102 (H) 08/25/2020 0604   Lab Results  Component Value Date   HGBA1C 5.0 08/25/2020   MR BRAIN WO CONTRAST  Result Date: 08/24/2020 CLINICAL DATA:  Code stroke follow-up EXAM: MRI HEAD WITHOUT CONTRAST TECHNIQUE: Multiplanar, multiecho pulse sequences of the brain and surrounding structures were obtained without intravenous contrast. COMPARISON:  Prior MRI from 2014 is unavailable at time of dictation. FINDINGS: Significant motion  artifact is present. Below findings are within this limitation. Brain: There is no acute infarction. Foci of susceptibility hypointensity in the left insular region and right putamen may reflect chronic microhemorrhage or mineralization. There are chronic infarcts of the left temporal, parietal, and occipital lobes. Chronic infarct of the left basal ganglia and adjacent white matter. There is  marked volume loss in the left cerebral hemisphere with ex vacuo dilatation of the left lateral ventricle. Associated wallerian degeneration along the left cerebral peduncle and contralateral right cerebellar volume loss noted. There is no intracranial mass or mass effect. No extra-axial collection. Vascular: Major vessel flow voids at the skull base are preserved. Skull and upper cervical spine: Normal marrow signal is preserved. Sinuses/Orbits: No apparent abnormality. Other: Patchy right mastoid fluid opacification. IMPRESSION: Significantly motion degraded. No acute infarction. Multiple chronic infarcts. Electronically Signed   By: Guadlupe Spanish M.D.   On: 08/24/2020 14:50   EEG adult  Result Date: 08/24/2020 Charlsie Quest, MD     08/24/2020 11:28 AM Patient Name: PATRECIA VEIGA MRN: [id number] Epilepsy Attending: Charlsie Quest Referring Physician/Provider: Lanae Boast, NP Date: 08/24/2020 Duration: 23.19 mins Patient history: 65 year old female with history of stroke and residual right-sided weakness,aphasia found to be acutely altered with right gaze preference.  EEG to evaluate for seizures. Level of alertness: lethargic AEDs during EEG study: LEV, PHT, Ativan Technical aspects: This EEG study was done with scalp electrodes positioned according to the 10-20 International system of electrode placement. Electrical activity was acquired at a sampling rate of 500Hz  and reviewed with a high frequency filter of 70Hz  and a low frequency filter of 1Hz . EEG data were recorded continuously and digitally stored.  Description: EEG showed continuous generalized low amplitude 3-5 theta-delta slowing.  Hyperventilation and photic stimulation were not performed.   Of note, study was technically difficult due to the significant myogenic artifact. ABNORMALITY - Continuous slow, generalized IMPRESSION: This technically difficult study suggestive of moderate to severe diffuse encephalopathy, nonspecific etiology.  No seizures or epileptiform discharges were seen throughout the recording. Priyanka   Overnight EEG with video  Result Date: 08/25/2020 , MD     08/25/2020  2:23 PM Patient Name: CATTIE TINEO MRN: Charlsie Quest Epilepsy Attending: 08/27/2020 Referring Physician/Provider: Hinda Lenis, NP Duration: 08/24/2020 1037 to 08/25/2020 1037  Patient history: 65 year old female with history of stroke and residual right-sided weakness,aphasia found to be acutely altered with right gaze preference.  EEG to evaluate for seizures.  Level of alertness: lethargic  AEDs during EEG study: LEV, PHT  Technical aspects: This EEG study was done with scalp electrodes positioned according to the 10-20 International system of electrode placement. Electrical activity was acquired at a sampling rate of 500Hz  and reviewed with a high frequency filter of 70Hz  and a low frequency filter of 1Hz . EEG data were recorded continuously and digitally stored.  Description: No clear posterior dominant rhythm was seen.  Continuous low amplitude 2 to 3 Hz delta slowing was noted in left hemisphere.  EEG also showed sharply contoured 4 to 5 Hz theta slowing in right frontotemporal region which gradually evolved to 2 to 3 Hz delta slowing and involve all of right hemisphere consistent with seizure. On video, patient was noted to be laying in bed without any clinical signs.  Average 4-5 seizures were noted per hour, lasting about 1 minute each.  Hyperventilation and photic stimulation were not performed.    ABNORMALITY - Focal  seizure without clinical signs, right frontotemporal region - Continuous slow, left hemisphere  IMPRESSION: This study showed seizures without clinical signs arising from right frontotemporal region, average 4-5 seizures per hour, lasting about 1 minute each, last seizure noted on 08/25/2020 at 1022.  Additionally there is evidence of cortical dysfunction arising from left hemisphere likely secondary to underlying structural abnormality/stroke.  Charlsie Quest   CT HEAD CODE STROKE WO CONTRAST  Result Date: 08/24/2020 CLINICAL DATA:  Code stroke.  Aphasia, right gaze EXAM: CT HEAD WITHOUT CONTRAST TECHNIQUE: Contiguous axial images were obtained from the base of the skull through the vertex without intravenous contrast. COMPARISON:  Images from 2014 study are unavailable at time of dictation FINDINGS: Brain: No acute intracranial hemorrhage, mass effect, or edema. There is no definite acute appearing loss of gray-white differentiation. Chronic infarcts of the left temporal, parietal, and occipital lobes. Chronic infarct of the left basal ganglia and adjacent white matter. Ex vacuo dilatation of the left lateral ventricle. Additional patchy and confluent areas of low-attenuation in the supratentorial white matter are nonspecific but probably reflect chronic microvascular ischemic changes. Prominence of the ventricles and sulci reflects parenchymal volume loss. There is no extra-axial collection. 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 Vascular: No hyperdense vessel. There is intracranial atherosclerotic calcification at the skull base. Skull: Unremarkable. Sinuses/Orbits: No acute abnormality. Other: Mastoid air cells are clear. IMPRESSION: No acute intracranial hemorrhage or mass effect. No definite acute infarction. Sequelae of chronic left cerebral infarcts. Chronic microvascular ischemic changes. These results were  communicated to Dr. Derry Lory at 8:45 am on 08/24/2020 by text page via the [facility] messaging system. Electronically Signed   By: Guadlupe Spanish M.D.   On: 08/24/2020 08:47      Discharge Instructions: Discharge Instructions     Diet - low sodium heart healthy   Complete by: As directed    Discharge instructions   Complete by: As directed    Ms Scherr, it was a pleasure taking care of you during your time here.  You came in because you had several seizures and were treated while you were here.  Please take note of the following:  1.  Please start taking Keppra, Vimpat, Dilantin at the new adjusted doses.  Please be sure to make an appointment and follow-up with your neurologist in 1 to 2 weeks.  2.  You are having some retention of urine and thus needed to go to the nursing facility with a Foley catheter in place.  Please be sure to make an appointment with urology in 1 month for removal of your Foley catheter.  3.  I have prescribed a 3-day course of antibiotics to treat your likely urinary tract infection.   Increase activity slowly   Complete by: As directed        Signed: Merrilyn Puma, MD 08/30/2020, 2:04 PM   Pager: [phone]

## 2020-08-30 NOTE — Progress Notes (Signed)
Phenytoin Consult Indication: Seizures   No Known Allergies  Patient Measurements: Height: 5\' 3"  (160 cm) Weight: 60.8 kg (134 lb 0.6 oz) IBW/kg (Calculated) : 52.4 TPN AdjBW (KG): 60.8 Body mass index is 23.74 kg/m.   Vital signs: Temp: 99.3 F (37.4 C) (06/29 0913) Temp Source: Oral (06/29 0913) BP: 103/59 (06/29 0913) Pulse Rate: 85 (06/29 0913)  Labs: No results found for: ALBUMIN, PHENYTOIN, PHENYTFREE  Lab Results  Component Value Date   PHENYTOIN 13.0 08/26/2020   Estimated Creatinine Clearance: 51 mL/min (by C-G formula based on SCr of 0.91 mg/dL).   Medications:  Medications Prior to Admission  Medication Sig Dispense Refill Last Dose   acetaminophen (TYLENOL) 500 MG tablet Take 1,000 mg by mouth daily.   08/23/2020 at 0800   amLODipine (NORVASC) 5 MG tablet Take 5 mg by mouth daily.   08/23/2020 at 0800   baclofen (LIORESAL) 10 MG tablet Take 5 mg by mouth 3 (three) times daily.   08/23/2020 at 1400   Cholecalciferol (VITAMIN D3) 50 MCG (2000 UT) TABS Take 2,000 Units by mouth daily.   08/23/2020 at 0800   clopidogrel (PLAVIX) 75 MG tablet Take 75 mg by mouth daily.   08/23/2020 at 0800   Ensure Plus (ENSURE PLUS) LIQD Take 237 mLs by mouth 2 (two) times daily. Strawberry flavor   08/23/2020 at 0800   ferrous sulfate 325 (65 FE) MG tablet Take 325 mg by mouth daily.   08/23/2020 at 0800   levETIRAcetam (KEPPRA) 250 MG tablet Take 125 mg by mouth 2 (two) times daily.   08/23/2020 at 0800   Multiple Vitamins-Minerals (MULTIVITAMIN WITH MINERALS) tablet Take 1 tablet by mouth daily.   08/23/2020 at 0800   Omega-3 Fatty Acids (FISH OIL) 1000 MG CAPS Take 2,000 mg by mouth 2 (two) times daily.   08/23/2020 at 0800   phenytoin (DILANTIN) 100 MG ER capsule Take 100 mg by mouth 2 (two) times daily. Take with 30mg  capsule for a total of 130mg  two times daily.   08/23/2020 at 0730   phenytoin (DILANTIN) 30 MG ER capsule Take 30 mg by mouth 2 (two) times daily. Take with 100mg  capsule  for a total of 130mg  two times daily.   08/23/2020 at 0800   sertraline (ZOLOFT) 100 MG tablet Take 50 mg by mouth daily.   08/23/2020 at 0800   simvastatin (ZOCOR) 20 MG tablet Take 20 mg by mouth at bedtime.   08/22/2020 at 2000    Assessment: 5 YOF who presented with altered mental status. MD felt the presentation to be most consistent with seizure activity and patient was loaded with IV Keppra and started on LTM EEG. Pharmacy consulted dose phenytoin therapy.   Discussed case with neurology who agrees that it is okay to resume home dose as IV while awaiting phenytoin level and adjusting accordingly. He was on phenytoin 130mg  po bid at home and dose was changed to 100mg  po tid on 6/24. Phenytoin 300mg  IV was given on 6/24 and fosphenytoin 200mg  was given on 6/25 -free phenytoin was undetectable on 6/23, total level was 7.1 -phenytoin level= 13 on 6/25 (level was before fosphenytoin administration)   Goals of care:  Total phenytoin level: 10-20 mcg/ml Free phenytoin level: 1-2 mcg/ml  Plan:  -Continue phenytoin 100mg  po tid -Will check a phenytoin level on Friday  , PharmD Clinical Pharmacist **Pharmacist phone directory can now be found on [email or web address] ([email or web address]).  Listed under [facility].

## 2020-08-30 NOTE — Progress Notes (Signed)
  Speech Language Pathology Treatment: Dysphagia  Patient Details Name: Casey Edwards MRN: [id number] DOB: [birth year] Today's Date: 08/30/2020 Time: 1205-1219 SLP Time Calculation (min) (ACUTE ONLY): 14 min  Assessment / Plan / Recommendation Clinical Impression  Upon initially walking into the room, pt appeared to be orally manipulating bolus - but upon oral inspection she was clear.  Pt used her left hand to feed herself and consumed water, yogurt and graham crackers.  She consumes po at rapid rate (most especially with yogurt) but no oral pocketing and no indication of pharyngeal dysphagia. Moderate visual cues for pt to slow rate of intake required. Labial minimal retention of yogurt present with pt initiating clearance after SLP used paper towel to clear intially.  SLP introduced oral suction to opt for her to use PRN - and left in her bed - turned on with covering in place.    Recommend set up for meals and intermittent supervision to assure pt is managing po well without oral retention nor coughing with po intake.  Pt with good intake per chart review.  Given pt is edentulous at this time, dys3 diet is recommended long term.  SLP educated pt to recommendations *her aphasia likely impairs comprehension and no family present.* Will sign off - informed RN.  Thanks.    HPI HPI: Casey Edwards is a 65 yo F w/ PMH of Cva w/ residual aphasia, right sided weakness, seizure disorder, htn, hld presenting to [facility] with new onset left sided weakness and altered mental status likely 2/2 post-ictal state from breakthrough seizure vs acute CVA.  Imaging was negative for acute stroke,chronic infarcts noted.  Pt had been seen for swallow eval and was started on dys1/thin diet.  Advancement in diet was made on 08/29/2020 and follow up indicated to assure tolerance.  RN advised pt eating well and managing dietary advancement well.      SLP Plan  All goals met       Recommendations  Diet recommendations:  Dysphagia 3 (mechanical soft);Thin liquid Liquids provided via: Cup;Straw Medication Administration: Whole meds with puree Supervision: Patient able to self feed Compensations: Slow rate;Small sips/bites;Minimize environmental distractions;Lingual sweep for clearance of pocketing (assure right pocketing clear) Postural Changes and/or Swallow Maneuvers: Seated upright 90 degrees;Upright 30-60 min after meal                Oral Care Recommendations: Oral care BID Follow up Recommendations: None SLP Visit Diagnosis: Dysphagia, oral phase (R13.11) Plan: All goals met       GO               Casey Lime, MS [facility] SLP Acute Rehab Services Office [phone] Pager [phone]  Casey Edwards 08/30/2020, 1:42 PM

## 2020-08-30 NOTE — TOC Transition Note (Signed)
Transition of Care [facility]) - CM/SW Discharge Note   Patient Details  Name: Casey Edwards MRN: [id number] Date of Birth: 04/04/1955  [facility] CM/SW Contact:  Kermit Balo, RN Phone Number: 08/30/2020, 2:32 PM   Clinical Narrative:    Patient is discharging to [facility]. Brother is aware. Bedside RN updated and d/c packet at the desk.   Room: 422W Number for report: [phone]   Final next level of care: Skilled Nursing Facility Barriers to Discharge: No Barriers Identified   Patient Goals and CMS Choice Patient states their goals for this hospitalization and ongoing recovery are:: patient unable to participate in goal setting, not fully oriented CMS Medicare.gov Compare Post Acute Care list provided to:: Patient Represenative (must comment) Choice offered to / list presented to : Sibling  Discharge Placement              Patient chooses bed at: [facility] Patient to be transferred to facility by: [facility] Name of family member notified: Brother: [name] Patient and family notified of of transfer: 08/30/20  Discharge Plan and Services     Post Acute Care Choice: Skilled Nursing Facility                               Social Determinants of Health (SDOH) Interventions     Readmission Risk Interventions No flowsheet data found.

## 2020-08-31 LAB — URINE CULTURE: Culture: NO GROWTH

## 2020-12-08 ENCOUNTER — Emergency Department (HOSPITAL_COMMUNITY): Payer: No Typology Code available for payment source

## 2020-12-08 ENCOUNTER — Inpatient Hospital Stay (HOSPITAL_COMMUNITY)
Admission: EM | Admit: 2020-12-08 | Discharge: 2020-12-15 | DRG: 871 | Disposition: A | Discharge status: Skilled Nursing Facility | Payer: No Typology Code available for payment source | Source: Skilled Nursing Facility | Attending: Student | Admitting: Student

## 2020-12-08 ENCOUNTER — Other Ambulatory Visit: Payer: Self-pay

## 2020-12-08 ENCOUNTER — Encounter (HOSPITAL_COMMUNITY): Payer: Self-pay

## 2020-12-08 DIAGNOSIS — E7800 Pure hypercholesterolemia, unspecified: Secondary | ICD-10-CM | POA: Diagnosis present

## 2020-12-08 DIAGNOSIS — R4701 Aphasia: Secondary | ICD-10-CM | POA: Diagnosis present

## 2020-12-08 DIAGNOSIS — Z8673 Personal history of transient ischemic attack (TIA), and cerebral infarction without residual deficits: Secondary | ICD-10-CM | POA: Diagnosis not present

## 2020-12-08 DIAGNOSIS — N170 Acute kidney failure with tubular necrosis: Secondary | ICD-10-CM | POA: Diagnosis present

## 2020-12-08 DIAGNOSIS — A419 Sepsis, unspecified organism: Secondary | ICD-10-CM | POA: Diagnosis present

## 2020-12-08 DIAGNOSIS — Z7189 Other specified counseling: Secondary | ICD-10-CM | POA: Diagnosis not present

## 2020-12-08 DIAGNOSIS — I69351 Hemiplegia and hemiparesis following cerebral infarction affecting right dominant side: Secondary | ICD-10-CM

## 2020-12-08 DIAGNOSIS — L89620 Pressure ulcer of left heel, unstageable: Secondary | ICD-10-CM | POA: Diagnosis present

## 2020-12-08 DIAGNOSIS — N10 Acute tubulo-interstitial nephritis: Secondary | ICD-10-CM | POA: Diagnosis present

## 2020-12-08 DIAGNOSIS — R54 Age-related physical debility: Secondary | ICD-10-CM | POA: Diagnosis present

## 2020-12-08 DIAGNOSIS — N12 Tubulo-interstitial nephritis, not specified as acute or chronic: Secondary | ICD-10-CM | POA: Diagnosis not present

## 2020-12-08 DIAGNOSIS — I10 Essential (primary) hypertension: Secondary | ICD-10-CM | POA: Diagnosis present

## 2020-12-08 DIAGNOSIS — N179 Acute kidney failure, unspecified: Secondary | ICD-10-CM | POA: Diagnosis not present

## 2020-12-08 DIAGNOSIS — F329 Major depressive disorder, single episode, unspecified: Secondary | ICD-10-CM | POA: Diagnosis present

## 2020-12-08 DIAGNOSIS — R1310 Dysphagia, unspecified: Secondary | ICD-10-CM | POA: Diagnosis present

## 2020-12-08 DIAGNOSIS — K209 Esophagitis, unspecified without bleeding: Secondary | ICD-10-CM | POA: Diagnosis present

## 2020-12-08 DIAGNOSIS — F0390 Unspecified dementia without behavioral disturbance: Secondary | ICD-10-CM | POA: Diagnosis not present

## 2020-12-08 DIAGNOSIS — G40909 Epilepsy, unspecified, not intractable, without status epilepticus: Secondary | ICD-10-CM | POA: Diagnosis present

## 2020-12-08 DIAGNOSIS — D649 Anemia, unspecified: Secondary | ICD-10-CM | POA: Diagnosis present

## 2020-12-08 DIAGNOSIS — R627 Adult failure to thrive: Secondary | ICD-10-CM | POA: Diagnosis present

## 2020-12-08 DIAGNOSIS — G9341 Metabolic encephalopathy: Secondary | ICD-10-CM | POA: Diagnosis present

## 2020-12-08 DIAGNOSIS — R6521 Severe sepsis with septic shock: Secondary | ICD-10-CM | POA: Diagnosis present

## 2020-12-08 DIAGNOSIS — Z7902 Long term (current) use of antithrombotics/antiplatelets: Secondary | ICD-10-CM

## 2020-12-08 DIAGNOSIS — R1312 Dysphagia, oropharyngeal phase: Secondary | ICD-10-CM | POA: Diagnosis not present

## 2020-12-08 DIAGNOSIS — F0393 Unspecified dementia, unspecified severity, with mood disturbance: Secondary | ICD-10-CM | POA: Diagnosis present

## 2020-12-08 DIAGNOSIS — E871 Hypo-osmolality and hyponatremia: Secondary | ICD-10-CM | POA: Diagnosis present

## 2020-12-08 DIAGNOSIS — Z20822 Contact with and (suspected) exposure to covid-19: Secondary | ICD-10-CM | POA: Diagnosis present

## 2020-12-08 DIAGNOSIS — R7989 Other specified abnormal findings of blood chemistry: Secondary | ICD-10-CM | POA: Diagnosis not present

## 2020-12-08 DIAGNOSIS — E559 Vitamin D deficiency, unspecified: Secondary | ICD-10-CM | POA: Diagnosis present

## 2020-12-08 DIAGNOSIS — Z66 Do not resuscitate: Secondary | ICD-10-CM | POA: Diagnosis present

## 2020-12-08 DIAGNOSIS — E861 Hypovolemia: Secondary | ICD-10-CM | POA: Diagnosis present

## 2020-12-08 DIAGNOSIS — G8191 Hemiplegia, unspecified affecting right dominant side: Secondary | ICD-10-CM | POA: Diagnosis not present

## 2020-12-08 DIAGNOSIS — E876 Hypokalemia: Secondary | ICD-10-CM | POA: Diagnosis not present

## 2020-12-08 DIAGNOSIS — Z515 Encounter for palliative care: Secondary | ICD-10-CM | POA: Diagnosis not present

## 2020-12-08 DIAGNOSIS — E43 Unspecified severe protein-calorie malnutrition: Secondary | ICD-10-CM | POA: Diagnosis present

## 2020-12-08 DIAGNOSIS — L89311 Pressure ulcer of right buttock, stage 1: Secondary | ICD-10-CM | POA: Diagnosis not present

## 2020-12-08 DIAGNOSIS — Z79899 Other long term (current) drug therapy: Secondary | ICD-10-CM

## 2020-12-08 DIAGNOSIS — Z87440 Personal history of urinary (tract) infections: Secondary | ICD-10-CM

## 2020-12-08 LAB — PROTIME-INR
INR: 1.4 — ABNORMAL HIGH (ref 0.8–1.2)
Prothrombin Time: 17.4 seconds — ABNORMAL HIGH (ref 11.4–15.2)

## 2020-12-08 LAB — URINALYSIS, MICROSCOPIC (REFLEX): WBC, UA: >50 WBC/hpf (ref 0–5)

## 2020-12-08 LAB — URINALYSIS, ROUTINE W REFLEX MICROSCOPIC

## 2020-12-08 LAB — COMPREHENSIVE METABOLIC PANEL
ALT: 23 U/L (ref 0–44)
AST: 34 U/L (ref 15–41)
Albumin: 1.6 g/dL — ABNORMAL LOW (ref 3.5–5.0)
Alkaline Phosphatase: 102 U/L (ref 38–126)
Anion gap: 14 (ref 5–15)
BUN: 55 mg/dL — ABNORMAL HIGH (ref 8–23)
CO2: 18 mmol/L — ABNORMAL LOW (ref 22–32)
Calcium: 8.1 mg/dL — ABNORMAL LOW (ref 8.9–10.3)
Chloride: 92 mmol/L — ABNORMAL LOW (ref 98–111)
Creatinine, Ser: 3.14 mg/dL — ABNORMAL HIGH (ref 0.44–1.00)
GFR, Estimated: 16 mL/min — ABNORMAL LOW (ref >60)
Glucose, Bld: 103 mg/dL — ABNORMAL HIGH (ref 70–99)
Potassium: 4.1 mmol/L (ref 3.5–5.1)
Sodium: 124 mmol/L — ABNORMAL LOW (ref 135–145)
Total Bilirubin: 0.9 mg/dL (ref 0.3–1.2)
Total Protein: 6.1 g/dL — ABNORMAL LOW (ref 6.5–8.1)

## 2020-12-08 LAB — RESP PANEL BY RT-PCR (FLU A&B, COVID) ARPGX2
Influenza A by PCR: NEGATIVE
Influenza B by PCR: NEGATIVE
SARS Coronavirus 2 by RT PCR: NEGATIVE

## 2020-12-08 LAB — LACTIC ACID, PLASMA
Lactic Acid, Venous: 2.6 mmol/L (ref 0.5–1.9)
Lactic Acid, Venous: 3.3 mmol/L (ref 0.5–1.9)

## 2020-12-08 LAB — APTT: aPTT: 28 seconds (ref 24–36)

## 2020-12-08 MED ORDER — VANCOMYCIN HCL 1250 MG/250ML IV SOLN
1250.0000 mg | Freq: Once | INTRAVENOUS | Status: AC
  Administered 2020-12-08: 1250 mg via INTRAVENOUS
  Filled 2020-12-08: qty 250

## 2020-12-08 MED ORDER — DOCUSATE SODIUM 100 MG PO CAPS: 100.0000 mg | ORAL_CAPSULE | Freq: Two times a day (BID) | ORAL | Status: DC | PRN

## 2020-12-08 MED ORDER — LEVETIRACETAM IN NACL 1500 MG/100ML IV SOLN
1500.0000 mg | Freq: Two times a day (BID) | INTRAVENOUS | Status: DC
  Administered 2020-12-09 – 2020-12-10 (×5): 1500 mg via INTRAVENOUS
  Filled 2020-12-08 (×7): qty 100

## 2020-12-08 MED ORDER — METRONIDAZOLE 500 MG/100ML IV SOLN
500.0000 mg | Freq: Once | INTRAVENOUS | Status: AC
  Administered 2020-12-08: 500 mg via INTRAVENOUS
  Filled 2020-12-08: qty 100

## 2020-12-08 MED ORDER — PHENYTOIN SODIUM 50 MG/ML IJ SOLN
100.0000 mg | Freq: Three times a day (TID) | INTRAMUSCULAR | Status: DC
  Administered 2020-12-09 – 2020-12-11 (×8): 100 mg via INTRAVENOUS
  Filled 2020-12-08 (×9): qty 2

## 2020-12-08 MED ORDER — VANCOMYCIN VARIABLE DOSE PER UNSTABLE RENAL FUNCTION (PHARMACIST DOSING): Status: DC

## 2020-12-08 MED ORDER — LACTATED RINGERS IV BOLUS (SEPSIS)
1000.0000 mL | Freq: Once | INTRAVENOUS | Status: AC
  Administered 2020-12-08: 1000 mL via INTRAVENOUS

## 2020-12-08 MED ORDER — NOREPINEPHRINE 4 MG/250ML-% IV SOLN
0.0000 ug/min | INTRAVENOUS | Status: DC
  Administered 2020-12-08: 2 ug/min via INTRAVENOUS
  Administered 2020-12-09: 4 ug/min via INTRAVENOUS
  Administered 2020-12-10 (×2): 10 ug/min via INTRAVENOUS
  Filled 2020-12-08 (×4): qty 250

## 2020-12-08 MED ORDER — SODIUM CHLORIDE 0.9 % IV SOLN
2.0000 g | Freq: Once | INTRAVENOUS | Status: AC
  Administered 2020-12-08: 2 g via INTRAVENOUS
  Filled 2020-12-08: qty 2

## 2020-12-08 MED ORDER — SODIUM CHLORIDE 0.9 % IV SOLN
2.0000 g | INTRAVENOUS | Status: DC
  Administered 2020-12-09 – 2020-12-10 (×2): 2 g via INTRAVENOUS
  Filled 2020-12-08 (×2): qty 2

## 2020-12-08 MED ORDER — POLYETHYLENE GLYCOL 3350 17 G PO PACK: 17.0000 g | PACK | Freq: Every day | ORAL | Status: DC | PRN

## 2020-12-08 MED ORDER — HEPARIN SODIUM (PORCINE) 5000 UNIT/ML IJ SOLN
5000.0000 [IU] | Freq: Three times a day (TID) | INTRAMUSCULAR | Status: DC
  Administered 2020-12-09 – 2020-12-15 (×20): 5000 [IU] via SUBCUTANEOUS
  Filled 2020-12-08 (×19): qty 1

## 2020-12-08 MED ORDER — SODIUM CHLORIDE 0.9 % IV SOLN
200.0000 mg | Freq: Two times a day (BID) | INTRAVENOUS | Status: DC
  Administered 2020-12-09 – 2020-12-10 (×5): 200 mg via INTRAVENOUS
  Filled 2020-12-08 (×12): qty 20

## 2020-12-08 MED ORDER — PANTOPRAZOLE SODIUM 40 MG IV SOLR
40.0000 mg | Freq: Every day | INTRAVENOUS | Status: DC
Start: 2020-12-08 — End: 2020-12-10
  Administered 2020-12-09 (×2): 40 mg via INTRAVENOUS
  Filled 2020-12-08 (×2): qty 40

## 2020-12-08 MED ORDER — LACTATED RINGERS IV SOLN: INTRAVENOUS | Status: AC

## 2020-12-08 NOTE — H&P (Signed)
NAME:  Casey Edwards, MRN:  [id number], DOB:  04-May-1955, LOS: 0 ADMISSION DATE:  12/08/2020, CONSULTATION DATE:  10/7 REFERRING MD:  Dr. Audley Hose, CHIEF COMPLAINT:  Urosepsis   History of Present Illness:  Patient is a 65 year old female with pertinent past medical history of CVA, dementia, seizures, HTN, HLD, encephalopathy, right-sided hemiparesis, MDD presents to [facility] ED on 10/7 with AMS and hypotension.  According to family, patient can typically speak and state name but today has been obtunded.  Patient has been living in assisted living since earlier this year.  She was recently admitted in June 2022 with UTI.   Upon arrival to ED on 10/7 Foley catheter was placed with dark cloudy urine.  Urinalysis indicative of UTI.  Patient remains altered and not responding to sternal rub.  Patient was on room air with sats 99%.  White blood cell 55, hemoglobin 9.8.  NA 124, BUN 55, creatinine 3.14.  Initial lactic acid 2.6 now 3.3.  CXR with no significant findings.  CT head negative for acute abnormality; atrophy and chronic small vessel ischemic changes of white matter.  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  Patient was started on cefepime, Vanco, metronidazole.  Patient given 2 L boluses of fluid and still remained hypotensive and required low-dose levo to be started to keep MAP greater than 65.  Since starting antibiotics patient's mental status is slowly starting to improve with patient opening eyes to command but still not speaking and following commands.  PCCM consulted for ICU admission and medical management.  Pertinent  Medical History   Past Medical History:  Diagnosis Date    Hypercholesteremia    Hypertension    Seizures (HCC)    Stroke [facility])    Vitamin D deficiency      Significant Hospital Events: Including procedures, antibiotic start and stop dates in addition to other pertinent events   10/7: admitted to [facility] ICU; on 2 mcg Levo; received 2L bolus  Interim History / Subjective:  Patient is opening eyes and looking at me but not following commands or speaking. Patient on 2 MCG of levo and continuous fluids 150 with map in the 70s Patient on room air with sats 99%  Objective   Blood pressure (!) 79/60, pulse 97, temperature 97.9 F (36.6 C), temperature source Axillary, resp. rate (!) 26, SpO2 99 %.        Intake/Output Summary (Last 24 hours) at 12/08/2020 2223 Last data filed at 12/08/2020 2116 Gross per 24 hour  Intake 2100 ml  Output --  Net 2100 ml   There were no vitals filed for this visit.  Examination: General:  elderly and ill appearing HEENT: MM pink/moist Neuro: Opens eyes to command and looks at you but not following commands.  Not speaking.  PERRL CV: s1s2, RRR, no m/r/g PULM:  dim clear BS bilaterally; RA sats 99% GI: soft, bsx4 active; no pain upon palpation of abdomen Extremities: warm/dry, no edema  Skin: no rashes or lesions   Resolved Hospital Problem list     Assessment & Plan:  Septic shock UTI with Possible pyelonephritis Severe leukocytosis 10 mm bladder calculus P: -continue IV fluids at 125 ml/hr -continue to wean levo for MAP >65 -Trend fever/WBC curve -Trend lactate -  Follow urine culture and BC x2 -Continue cefepime and Vanc -Trend urine output: Consider ultrasound to evaluate calculus in bladder  Acute metabolic encephalopathy Hx of CVA Hx of seizures P: -Avoid sedating medicines -Frequent neurochecks -Continue home seizure meds: Phenytoin, Vimpat, Keppra -consider starting plavix when able to take p.o.  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  Hx of MDD P: Consider starting home Zoloft when able to take meds p.o.  Best Practice (right click and "Reselect all SmartList Selections" daily)   Diet/type: NPO DVT prophylaxis: prophylactic heparin  GI prophylaxis: PPI Lines: N/A Foley:  Yes, and it is still needed Code Status:  DNR Last date of multidisciplinary goals of care discussion [pending; family spoke with ER doc 10/7 and made DNR/DNI]  Labs   CBC: Recent Labs  Lab 12/08/20 1702  WBC 55.4*  NEUTROABS 52.6*  HGB 9.8*  HCT 28.5*  MCV 81.9  PLT 429*    Basic Metabolic Panel: Recent Labs  Lab 12/08/20 1702  NA 124*  K 4.1  CL 92*  CO2 18*  GLUCOSE 103*  BUN 55*  CREATININE 3.14*  CALCIUM 8.1*   GFR: CrCl cannot be calculated (Unknown ideal weight.). Recent Labs  Lab 12/08/20 1702 12/08/20 1902  WBC 55.4*  --   LATICACIDVEN 2.6* 3.3*    Liver Function Tests: Recent Labs  Lab 12/08/20 1702  AST 34  ALT 23  ALKPHOS 102  BILITOT 0.9  PROT 6.1*  ALBUMIN 1.6*   No results for input(s): LIPASE, AMYLASE in the last 168 hours. No results for input(s): AMMONIA in the last 168 hours.  ABG    Component Value Date/Time   TCO2 24 08/24/2020 0841     Coagulation Profile: Recent Labs  Lab 12/08/20 1702  INR 1.4*    Cardiac Enzymes: No results for input(s): CKTOTAL, CKMB, CKMBINDEX, TROPONINI in the last 168 hours.  HbA1C: Hgb A1c MFr Bld  Date/Time Value  Ref Range Status  08/25/2020 06:04 AM 5.0 4.8 - 5.6 % Final    Comment:    (NOTE) Pre diabetes:          5.7%-6.4%  Diabetes:              >6.4%  Glycemic control for   <7.0% adults with diabetes     CBG: No results for input(s): GLUCAP in the last 168 hours.  Review of Systems:   Unable to obtain. Patient with AMS and not following commands.  Past Medical History:  She,  has a past medical history of Hypercholesteremia, Hypertension, Seizures (HCC), Stroke (HCC), and Vitamin D deficiency.   Surgical History:  History reviewed. No pertinent surgical history.   Social History:   reports that she has never smoked. She has never used smokeless tobacco. She reports that she does not drink alcohol and does not use drugs.   Family History:  Her family history is not on file.   Allergies No Known Allergies   Home Medications  Prior to Admission  medications   Medication Sig Start Date End Date Taking? Authorizing Provider  acetaminophen (TYLENOL) 500 MG tablet Take 1,000 mg by mouth daily.   Yes [provider]  amLODipine (NORVASC) 5 MG tablet Take 5 mg by mouth daily.   Yes [provider]  baclofen (LIORESAL) 10 MG tablet Take 0.5 tablets (5 mg total) by mouth 2 (two) times daily as needed for muscle spasms. 08/30/20  Yes Merrilyn Puma, MD  Cholecalciferol (VITAMIN D3) 50 MCG (2000 UT) TABS Take 2,000 Units by mouth daily.   Yes [provider]  clopidogrel (PLAVIX) 75 MG tablet Take 75 mg by mouth daily.   Yes [provider]  diclofenac Sodium (VOLTAREN) 1 % GEL Apply 4 g topically 3 (three) times daily. Apply topically to bilateral knees. 08/30/20  Yes Merrilyn Puma, MD  Ensure Plus (ENSURE PLUS) LIQD Take 237 mLs by mouth 2 (two) times daily. Strawberry flavor   Yes [provider]  ferrous sulfate 325 (65 FE) MG tablet Take 325 mg by mouth daily.   Yes [provider]  lacosamide (VIMPAT) 200 MG TABS tablet Take 1  tablet (200 mg total) by mouth 2 (two) times daily. 08/30/20  Yes Merrilyn Puma, MD  levETIRAcetam (KEPPRA) 750 MG tablet Take 2 tablets (1,500 mg total) by mouth 2 (two) times daily. 08/30/20  Yes Merrilyn Puma, MD  magnesium hydroxide (MILK OF MAGNESIA) 400 MG/5ML suspension Take 30 mLs by mouth daily as needed for mild constipation.   Yes [provider]  Multiple Vitamins-Minerals (MULTIVITAMIN WITH MINERALS) tablet Take 1 tablet by mouth daily.   Yes [provider]  Omega-3 Fatty Acids (FISH OIL) 1000 MG CAPS Take 2,000 mg by mouth 2 (two) times daily.   Yes [provider]  phenytoin (DILANTIN) 100 MG ER capsule Take 1 capsule (100 mg total) by mouth 3 (three) times daily. 08/30/20  Yes Merrilyn Puma, MD  rosuvastatin (CRESTOR) 20 MG tablet Take 1 tablet (20 mg total) by mouth at bedtime. 08/30/20  Yes Merrilyn Puma, MD  sertraline (ZOLOFT) 100 MG tablet Take 50 mg by mouth daily.   Yes [provider]  tamsulosin (FLOMAX) 0.4 MG CAPS capsule Take 0.4 mg by mouth daily.   Yes [provider]     Critical care time: 45 minutes     JD Anselm [facility] 12/08/2020, 10:23 PM  Please see [email or web address] for pager details.  From 7A-7P if no response, please call [phone]. After hours, please call ELink [phone].

## 2020-12-08 NOTE — ED Notes (Signed)
Edp at  the bedside order for bolus of lr 800  hanging

## 2020-12-08 NOTE — Progress Notes (Signed)
Pharmacy Antibiotic Note  Casey Edwards is a 65 y.o. female admitted on 12/08/2020 with sepsis.  Pharmacy has been consulted for cefepime and vancomycin dosing.  Patient with a history of CVA, seizures, hypertension, hyperlipidemia, encephalopathy, right-sided hemiparesis. Patient is presenting with AMS.  Patient's serum creatinine is 3.14 which is significantly above baseline. WBC 55.4; LA 2.6; afebrile  Plan: Cefepime 2 g q24hr Vancomycin 1250 mg once, subsequent dosing as indicated per random vancomycin level until renal function stable and/or improved, at which time scheduled dosing can be considered Follow-up cultures and de-escalate antibiotics as appropriate.     Temp (24hrs), Avg:97.9 F (36.6 C), Min:97.9 F (36.6 C), Max:97.9 F (36.6 C)  Recent Labs  Lab 12/08/20 1702  WBC 55.4*  CREATININE 3.14*  LATICACIDVEN 2.6*    CrCl cannot be calculated (Unknown ideal weight.).    No Known Allergies  Antimicrobials this admission: cefepime 10/7 >>  vancomycin 10/7 >>   Microbiology results: Pending  Thank you for allowing pharmacy to be a part of this patient's care.  Delmar Landau, PharmD, BCPS 12/08/2020 7:28 PM ED Clinical Pharmacist -  [phone]

## 2020-12-08 NOTE — ED Notes (Signed)
Kr bolus finshed  iv continued at 150/hr

## 2020-12-08 NOTE — Sepsis Progress Note (Signed)
Notified provider of need to order repeat lactic acid. ° °

## 2020-12-08 NOTE — ED Notes (Signed)
The pt makes eye contact but does not speak or answer questions asked

## 2020-12-08 NOTE — ED Triage Notes (Signed)
Pt BIB [facility] from [facility] c/o hypotension for today. Pt had a foley a few weeks ago that was removed but the nursing facility put in a new foley due to urine retention. Urine is very cloudy. Pt has a hx of a stroke and is normally able to talk and say her name but today has been nonverbal but will open her eyes. Per facility BP was 70/40 and pt was tachy.

## 2020-12-08 NOTE — ED Provider Notes (Signed)
[facility] EMERGENCY DEPARTMENT Provider Note   CSN: [id number] Arrival date & time: 12/08/20  1646     History No chief complaint on file.   Casey Edwards is a 65 y.o. female.  HPI Patient is a 65 year old female with a history of CVA, seizures, hypertension, hyperlipidemia, encephalopathy, right-sided hemiparesis, who presents to the emergency department due to altered mental status.  Patient brought in by EMS from [place] [facility] due to hypotension.  They state that patient can typically speak and state her name but today has been obtunded.  They placed a Foley catheter which produced about 1000 cc of urine that was dark/cloudy.  She was found to be hypotensive and EMS was called.  Patient currently obtunded and not responding to sternal rub.  Patient's brother is at bedside.  States that patient was placed in [place] for assisted living earlier this year.  States she has had multiple UTIs this year.  Per records, patient had an admission in June of this year and was found to have acute cystitis as well as urinary retention of unclear etiology.  Level 5 caveat due to altered mental status    Past Medical History:  Diagnosis Date   Hypercholesteremia    Hypertension    Seizures (HCC)    Stroke [facility])    Vitamin D deficiency     Patient Active Problem List   Diagnosis Date Noted   Anemia of chronic disease 08/25/2020   Aphasia as late effect of cerebrovascular accident 08/25/2020   Dementia (HCC) 08/25/2020   Right hemiparesis (HCC) 08/25/2020   Encephalopathy 08/25/2020   Seizure disorder (HCC) 08/25/2020   CVA (cerebral vascular accident) (HCC) 08/25/2020   Essential hypertension 07/22/2018   History of stroke 07/22/2018   Major depressive disorder 07/22/2018   Other hyperlipidemia 07/22/2018    History reviewed. No pertinent surgical history.   OB History   No obstetric history on file.     History reviewed. No pertinent family history.  Social  History   Tobacco Use   Smoking status: Never   Smokeless tobacco: Never  Substance Use Topics   Alcohol use: Never   Drug use: Never    Home Medications Prior to Admission medications   Medication Sig Start Date End Date Taking? Authorizing Provider  acetaminophen (TYLENOL) 500 MG tablet Take 1,000 mg by mouth daily.   Yes [provider]  amLODipine (NORVASC) 5 MG tablet Take 5 mg by mouth daily.   Yes [provider]  baclofen (LIORESAL) 10 MG tablet Take 0.5 tablets (5 mg total) by mouth 2 (two) times daily as needed for muscle spasms. 08/30/20  Yes Merrilyn Puma, MD  Cholecalciferol (VITAMIN D3) 50 MCG (2000 UT) TABS Take 2,000 Units by mouth daily.   Yes [provider]  clopidogrel (PLAVIX) 75 MG tablet Take 75 mg by mouth daily.   Yes [provider]  diclofenac Sodium (VOLTAREN) 1 % GEL Apply 4 g topically 3 (three) times daily. Apply topically to bilateral knees. 08/30/20  Yes Merrilyn Puma, MD  Ensure Plus (ENSURE PLUS) LIQD Take 237 mLs by mouth 2 (two) times daily. Strawberry flavor   Yes [provider]  ferrous sulfate 325 (65 FE) MG tablet Take 325 mg by mouth daily.   Yes [provider]  lacosamide (VIMPAT) 200 MG TABS tablet Take 1 tablet (200 mg total) by mouth 2 (two) times daily. 08/30/20  Yes Merrilyn Puma, MD  levETIRAcetam (KEPPRA) 750 MG tablet Take 2  tablets (1,500 mg total) by mouth 2 (two) times daily. 08/30/20  Yes Merrilyn Puma, MD  magnesium hydroxide (MILK OF MAGNESIA) 400 MG/5ML suspension Take 30 mLs by mouth daily as needed for mild constipation.   Yes [provider]  Multiple Vitamins-Minerals (MULTIVITAMIN WITH MINERALS) tablet Take 1 tablet by mouth daily.   Yes [provider]  Omega-3 Fatty Acids (FISH OIL) 1000 MG CAPS Take 2,000 mg by mouth 2 (two) times daily.   Yes [provider]  phenytoin (DILANTIN) 100 MG ER capsule Take 1 capsule (100 mg total) by mouth 3  (three) times daily. 08/30/20  Yes Merrilyn Puma, MD  rosuvastatin (CRESTOR) 20 MG tablet Take 1 tablet (20 mg total) by mouth at bedtime. 08/30/20  Yes Merrilyn Puma, MD  sertraline (ZOLOFT) 100 MG tablet Take 50 mg by mouth daily.   Yes [provider]  tamsulosin (FLOMAX) 0.4 MG CAPS capsule Take 0.4 mg by mouth daily.   Yes [provider]    Allergies    Patient has no known allergies.  Review of Systems   Review of Systems  Unable to perform ROS: Mental status change   Physical Exam Updated Vital Signs BP (!) 89/67   Pulse 100   Temp 97.9 F (36.6 C) (Axillary)   Resp (!) 24   SpO2 99%   Physical Exam Vitals and nursing note reviewed.  Constitutional:      General: She is in acute distress.     Appearance: She is ill-appearing and toxic-appearing. She is not diaphoretic.  HENT:     Head: Normocephalic and atraumatic.     Right Ear: External ear normal.     Left Ear: External ear normal.     Nose: Nose normal.  Eyes:     General: No scleral icterus.       Right eye: No discharge.        Left eye: No discharge.     Extraocular Movements: Extraocular movements intact.     Conjunctiva/sclera: Conjunctivae normal.  Cardiovascular:     Rate and Rhythm: Regular rhythm. Tachycardia present.     Pulses: Normal pulses.     Heart sounds: Normal heart sounds. No murmur heard.   No friction rub. No gallop.  Pulmonary:     Effort: Pulmonary effort is normal. No respiratory distress.     Breath sounds: Normal breath sounds. No stridor. No wheezing, rhonchi or rales.     Comments: Tachypneic. Abdominal:     General: Abdomen is flat.     Palpations: Abdomen is soft.     Tenderness: There is no abdominal tenderness.     Comments: Abdomen is flat and soft.  Does not exhibit signs of pain with deep palpation.  Musculoskeletal:        General: Normal range of motion.     Cervical back: Normal range of motion and neck supple. No tenderness.  Skin:    General:  Skin is warm and dry.  Neurological:     Comments: Patient lying in the supine position.  Obtunded.  Brother at bedside states she is typically A&O x1.   ED Results / Procedures / Treatments   Labs (all labs ordered are listed, but only abnormal results are displayed) Labs Reviewed  LACTIC ACID, PLASMA - Abnormal; Notable for the following components:      Result Value   Lactic Acid, Venous 2.6 (*)    All other components within normal limits  LACTIC ACID, PLASMA - Abnormal;  Notable for the following components:   Lactic Acid, Venous 3.3 (*)    All other components within normal limits  COMPREHENSIVE METABOLIC PANEL - Abnormal; Notable for the following components:   Sodium 124 (*)    Chloride 92 (*)    CO2 18 (*)    Glucose, Bld 103 (*)    BUN 55 (*)    Creatinine, Ser 3.14 (*)    Calcium 8.1 (*)    Total Protein 6.1 (*)    Albumin 1.6 (*)    GFR, Estimated 16 (*)    All other components within normal limits  CBC WITH DIFFERENTIAL/PLATELET - Abnormal; Notable for the following components:   WBC 55.4 (*)    RBC 3.48 (*)    Hemoglobin 9.8 (*)    HCT 28.5 (*)    Platelets 429 (*)    Neutro Abs 52.6 (*)    All other components within normal limits  PROTIME-INR - Abnormal; Notable for the following components:   Prothrombin Time 17.4 (*)    INR 1.4 (*)    All other components within normal limits  URINALYSIS, ROUTINE W REFLEX MICROSCOPIC - Abnormal; Notable for the following components:   Color, Urine STRAW (*)    APPearance TURBID (*)    Glucose, UA RESULTS UNAVAILABLE DUE TO INTERFERING SUBSTANCE (*)    Hgb urine dipstick RESULTS UNAVAILABLE DUE TO INTERFERING SUBSTANCE (*)    Bilirubin Urine RESULTS UNAVAILABLE DUE TO INTERFERING SUBSTANCE (*)    Ketones, ur RESULTS UNAVAILABLE DUE TO INTERFERING SUBSTANCE (*)    Protein, ur RESULTS UNAVAILABLE DUE TO INTERFERING SUBSTANCE (*)    Nitrite RESULTS UNAVAILABLE DUE TO INTERFERING SUBSTANCE (*)    Leukocytes,Ua RESULTS  UNAVAILABLE DUE TO INTERFERING SUBSTANCE (*)    All other components within normal limits  URINALYSIS, MICROSCOPIC (REFLEX) - Abnormal; Notable for the following components:   Bacteria, UA MANY (*)    All other components within normal limits  RESP PANEL BY RT-PCR (FLU A&B, COVID) ARPGX2  CULTURE, BLOOD (ROUTINE X 2)  CULTURE, BLOOD (ROUTINE X 2)  URINE CULTURE  APTT  MISCELLANEOUS GENETIC TEST  LACTIC ACID, PLASMA    EKG EKG Interpretation  Date/Time:  Friday December 08 2020 17:08:28 EDT Ventricular Rate:  95 PR Interval:  161 QRS Duration: 87 QT Interval:  329 QTC Calculation: 414 R Axis:   241 Text Interpretation: Sinus rhythm Left anterior fascicular block Low voltage, extremity leads Abnormal R-wave progression, late transition Confirmed by Norman Clay ([id number]) on 12/08/2020 9:03:44 PM  Radiology CT ABDOMEN PELVIS WO CONTRAST  Result Date: 12/08/2020 CLINICAL DATA:  Uncomplicated UTI, had a Foley catheter a few weeks ago with that was removed but had another Foley inserted due to urinary retention. Cloudy urine. History stroke. Altered mental status. History hypertension EXAM: CT ABDOMEN AND PELVIS WITHOUT CONTRAST TECHNIQUE: Multidetector CT imaging of the abdomen and pelvis was performed following the standard protocol without IV contrast. Sagittal and coronal MPR images reconstructed from axial data set. No oral contrast administered. Beam hardening artifacts in upper abdomen from patient's arms. COMPARISON:  None FINDINGS: Lower chest: Subsegmental atelectasis at both lung bases. Additional questionable area of infiltrate in LEFT lower lobe. Hepatobiliary: Dependent calculi within gallbladder. Liver unremarkable. Pancreas: Normal appearance Spleen: Normal appearance Adrenals/Urinary Tract: Thickening of BILATERAL adrenal glands without discrete mass. Both kidneys demonstrate cortical thickening and perinephric edema, without discrete mass. Urinary bladder decompressed by Foley  catheter. Air within bladder. Calcification adjacent to the catheter balloon consistent with calculus,  8 x 10 x 7 mm. Stomach/Bowel: Probable normal appendix coiled adjacent to cecal tip. Increased stool in rectum. Thickening of distal esophagus. Stomach and remaining bowel loops unremarkable. No evidence of bowel obstruction or wall thickening. Vascular/Lymphatic: Atherosclerotic calcifications aorta, iliac arteries, femoral arteries, coronary arteries. Aorta normal caliber. No adenopathy. Reproductive: Soft tissue mass exophytic from LEFT upper uterus suspected uterine leiomyoma 3.8 x 3.2 x 3.1 cm. Unremarkable LEFT ovary. Mass within RIGHT ovary 5.4 x 3.4 x 3.9 cm containing calcification and fat consistent with dermoid tumor. Other: Small amount of nonspecific free pelvic fluid. No free air. No hernia. Musculoskeletal: Superior endplate compression deformity of L5 vertebral body without surrounding infiltration to suggest acute. Osseous demineralization. 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 Cholelithiasis. 10 mm bladder calculus. Kidneys demonstrate cortical thickening with perinephric edema bilaterally, nonspecific but could represent pyelonephritis; recommend correlation with urinalysis. Bibasilar atelectasis with question small focus of infiltrate in LEFT lower lobe. Question uterine fibroid 3.8 cm diameter. Thickening of distal esophagus question esophagitis; recommend correlation with patient symptoms and consider non emergent follow-up esophagram or endoscopy assessment. Aortic Atherosclerosis (ICD10-I70.0). Electronically Signed   By: Ulyses Southward M.D.   On: 12/08/2020 19:38   CT HEAD WO CONTRAST ( )  Result Date: 12/08/2020 CLINICAL DATA:  Delirium EXAM: CT HEAD WITHOUT CONTRAST TECHNIQUE: Contiguous axial images were obtained from the base of the skull through the vertex without intravenous contrast. COMPARISON:  MRI 08/24/2020, CT brain 08/24/2020 FINDINGS: Brain: No acute  territorial infarction, hemorrhage or intracranial mass. Extensive chronic small vessel ischemic changes of the white matter. Advanced atrophy. Chronic white matter infarct on the right. Encephalomalacia within the left temporal, encephalomalacia within the left basal ganglia and thalamus. Similar ex vacuo dilatation of left lateral ventricle. Parietal and occipital lobes. Vascular: No hyperdense vessels.  Carotid vascular calcification Skull: Normal. Negative for fracture or focal lesion. Sinuses/Orbits: No acute finding. Other: None IMPRESSION: 1. No definite CT evidence for acute intracranial abnormality. 2. Atrophy and chronic small vessel ischemic changes of the white matter. Extensive left-sided encephalomalacia as described above. Electronically Signed   By: Jasmine Pang M.D.   On: 12/08/2020 19:31   DG Chest Port 1 View  Result Date: 12/08/2020 CLINICAL DATA:  Altered EXAM: PORTABLE CHEST 1 VIEW COMPARISON:  04/20/2019 FINDINGS: The heart size and mediastinal contours are within normal limits. Aortic atherosclerosis. Both lungs are clear. The visualized skeletal structures are unremarkable. IMPRESSION: No active disease. Electronically Signed   By: Jasmine Pang M.D.   On: 12/08/2020 18:07    Procedures .Critical Care Performed by: Placido Sou, PA-C Authorized by: Placido Sou, PA-C   Critical care provider statement:    Critical care time (minutes):  60   Critical care was necessary to treat or prevent imminent or life-threatening deterioration of the following conditions:  Sepsis, shock and renal failure   Critical care was time spent personally by me on the following activities:  Discussions with consultants, evaluation of patient's response to treatment, examination of patient, ordering and performing treatments and interventions, ordering and review of laboratory studies, ordering and review of radiographic studies, pulse oximetry, re-evaluation of patient's condition, obtaining  history from patient or surrogate and review of old charts   Medications Ordered in ED Medications  lactated ringers infusion ( Intravenous New Bag/Given 12/08/20 1849)  ceFEPIme (MAXIPIME) 2 g in sodium chloride 0.9 % 100 mL IVPB (has no administration in time range)  vancomycin variable dose per unstable renal function (pharmacist  dosing) (has no administration in time range)  norepinephrine (LEVOPHED) 4mg  in premix infusion (2 mcg/min Intravenous New Bag/Given 12/08/20 2119)  lactated ringers bolus 1,000 mL (0 mLs Intravenous Stopped 12/08/20 1847)  ceFEPIme (MAXIPIME) 2 g in sodium chloride 0.9 % 100 mL IVPB (0 g Intravenous Stopped 12/08/20 1818)  metroNIDAZOLE (FLAGYL) IVPB 500 mg (0 mg Intravenous Stopped 12/08/20 1948)  vancomycin (VANCOREADY) IVPB 1250 mg/250 mL (0 mg Intravenous Stopped 12/08/20 2118)    ED Course  I have reviewed the triage vital signs and the nursing notes.  Pertinent labs & imaging results that were available during my care of the patient were reviewed by me and considered in my medical decision making (see chart for details).  Clinical Course as of 12/08/20 2208  Fri Dec 08, 2020  1705 Patient given 900 cc of IV fluids in route by EMS.  30 cc/kg equates to 1824 cc of fluids.  We will give an additional liter of LR. [LJ]  1829 Blood Culture (routine x 2) [JH]  1955 Patient reassessed.  Still hyportensive but BP has improved with IV fluids.  CT scan concerning for possible ovarian tumor as well as pyelonephritis given patient's UA findings.  Her brother is no longer at bedside.  Unable to reach her listed contacts.  DNR at bedside. [LJ]  2010 I spoke to patient's daughter 2011 [phone]).  She is located in [phone] and is going to be unable to come to the hospital.  She would like [place] to continue moving forward with IV fluids as well as vasopressors if needed.  Confirms patient is DNR/DNI. [LJ]  2058 Patient finished a second liter of IV fluids  giving her a total of 2900 cc.  Systolic blood pressure still in the mid 80s.  Lactic acid worsening with most recent value at 7:02 PM at 3.3.  We will place a central line and discuss with critical care. [LJ]  2135 Patient discussed with the intensivist.  They will evaluate the patient and likely admit. [LJ]    Clinical Course User Index [JH] 2136, [place], MD [LJ] Eustace Moore, PA-C   MDM Rules/Calculators/A&P                          Pt is a 65 y.o. female who presents to the emergency department in septic shock likely due to pyelonephritis.  Labs: CBC with a white count of 55.4, platelets of 129, hemoglobin of 9.8, neutrophil of 52.6. UA results interfered with due to purulent discharge in patient's catheter bag.  They did note greater than 50 white blood cells, many bacteria, as well as white blood cell clumps. Respiratory panel is negative. Prothrombin time 17.4 with an INR of 1.4. APTT of 28. CMP with a sodium of 124, chloride of 92, CO2 of 18, glucose of 103, BUN of 55, creatinine of 3.14, calcium of 8.1, total protein of 6.1, albumin of 1.6, GFR 16. Blood cultures and urine culture obtained.  Imaging: Chest x-ray is negative. CT scan of the head shows no definite CT evidence for acute intracranial abnormality.  Atrophy and chronic small vessel ischemic changes of the white matter.  Extensive left-sided encephalomalacia and described above. 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 Cholelithiasis. 10 mm bladder calculus. Kidneys demonstrate cortical thickening with perinephric edema bilaterally, nonspecific but could represent pyelonephritis; recommend correlation with urinalysis. Bibasilar atelectasis with question  small focus of infiltrate in LEFT lower lobe. Question uterine fibroid 3.8 cm diameter. Thickening of distal esophagus question esophagitis; recommend correlation with patient symptoms and  consider non emergent follow-up esophagram or endoscopy assessment. Aortic Atherosclerosis  I, Placido Sou, PA-C, personally reviewed and evaluated these images and lab results as part of my medical decision-making.  Patient presented hypotensive.  She was resuscitated with 2900 cc of IV fluids with mild improvement in her BP to the mid 80s systolic.  Patient started on IV Levophed and blood pressure has improved to mid 90s systolic.  Tachypneic with oxygen saturations in the high 90s.  Lab work concerning for significant infection with a white blood cell count of 55.4.  UA appears infectious and CT scan concerning for pyelonephritis.  Lactic acid worsening despite fluid resuscitation.  Patient discussed with her daughter Karin Golden.  Her contact information is above.  She states she makes medical decisions for her mother.  She confirmed that she is DNR/DNI but would like [place] to continue moving forward with fluid resuscitation as well as vasopressors if needed.  I have placed a consult to our palliative care team so that they may be involved as well.  Patient discussed with intensivist.  They will evaluate the patient and likely accept her for admission at this time.  Note: Portions of this report may have been transcribed using voice recognition software. Every effort was made to ensure accuracy; however, inadvertent computerized transcription errors may be present.   Final Clinical Impression(s) / ED Diagnoses Final diagnoses:  Sepsis with acute renal failure and septic shock, due to unspecified organism, unspecified acute renal failure type (HCC)  Hyponatremia  Pyelonephritis   Rx / DC Orders ED Discharge Orders     None        Placido Sou, PA-C 12/08/20 2224    Cheryll Cockayne, MD 12/08/20 2250

## 2020-12-08 NOTE — ED Notes (Signed)
Critical care has seen the pt

## 2020-12-08 NOTE — Progress Notes (Signed)
[facility] is following sepsis bundle. 

## 2020-12-09 DIAGNOSIS — Z7189 Other specified counseling: Secondary | ICD-10-CM

## 2020-12-09 DIAGNOSIS — Z515 Encounter for palliative care: Secondary | ICD-10-CM | POA: Diagnosis not present

## 2020-12-09 DIAGNOSIS — Z66 Do not resuscitate: Secondary | ICD-10-CM | POA: Diagnosis not present

## 2020-12-09 DIAGNOSIS — N12 Tubulo-interstitial nephritis, not specified as acute or chronic: Secondary | ICD-10-CM | POA: Diagnosis not present

## 2020-12-09 DIAGNOSIS — N179 Acute kidney failure, unspecified: Secondary | ICD-10-CM | POA: Diagnosis not present

## 2020-12-09 DIAGNOSIS — R6521 Severe sepsis with septic shock: Secondary | ICD-10-CM | POA: Diagnosis not present

## 2020-12-09 DIAGNOSIS — A419 Sepsis, unspecified organism: Secondary | ICD-10-CM | POA: Diagnosis not present

## 2020-12-09 LAB — CBC WITH DIFFERENTIAL/PLATELET
Abs Immature Granulocytes: 0 10*3/uL (ref 0.00–0.07)
Basophils Absolute: 0 10*3/uL (ref 0.0–0.1)
Basophils Relative: 0 %
Eosinophils Absolute: 0 10*3/uL (ref 0.0–0.5)
Eosinophils Relative: 0 %
HCT: 27.9 % — ABNORMAL LOW (ref 36.0–46.0)
Hemoglobin: 9.8 g/dL — ABNORMAL LOW (ref 12.0–15.0)
Lymphocytes Relative: 0 %
Lymphs Abs: 0 10*3/uL — ABNORMAL LOW (ref 0.7–4.0)
MCH: 28.7 pg (ref 26.0–34.0)
MCHC: 35.1 g/dL (ref 30.0–36.0)
MCV: 81.6 fL (ref 80.0–100.0)
Monocytes Absolute: 1.7 10*3/uL — ABNORMAL HIGH (ref 0.1–1.0)
Monocytes Relative: 3 %
Neutro Abs: 53.9 10*3/uL — ABNORMAL HIGH (ref 1.7–7.7)
Neutrophils Relative %: 97 %
Platelets: 452 10*3/uL — ABNORMAL HIGH (ref 150–400)
RBC: 3.42 MIL/uL — ABNORMAL LOW (ref 3.87–5.11)
RDW: 13.9 % (ref 11.5–15.5)
WBC: 55.6 10*3/uL (ref 4.0–10.5)
nRBC: 0 % (ref 0.0–0.2)
nRBC: 0 /100 WBC

## 2020-12-09 LAB — MRSA NEXT GEN BY PCR, NASAL: MRSA by PCR Next Gen: NOT DETECTED

## 2020-12-09 LAB — CBC
HCT: 31 % — ABNORMAL LOW (ref 36.0–46.0)
Hemoglobin: 10.8 g/dL — ABNORMAL LOW (ref 12.0–15.0)
MCH: 28.6 pg (ref 26.0–34.0)
MCHC: 34.8 g/dL (ref 30.0–36.0)
MCV: 82.2 fL (ref 80.0–100.0)
Platelets: 409 10*3/uL — ABNORMAL HIGH (ref 150–400)
RBC: 3.77 MIL/uL — ABNORMAL LOW (ref 3.87–5.11)
RDW: 14.1 % (ref 11.5–15.5)
WBC: 54.3 10*3/uL (ref 4.0–10.5)
nRBC: 0 % (ref 0.0–0.2)

## 2020-12-09 LAB — URINE CULTURE

## 2020-12-09 LAB — COMPREHENSIVE METABOLIC PANEL
ALT: 23 U/L (ref 0–44)
AST: 35 U/L (ref 15–41)
Albumin: 1.5 g/dL — ABNORMAL LOW (ref 3.5–5.0)
Alkaline Phosphatase: 112 U/L (ref 38–126)
Anion gap: 11 (ref 5–15)
BUN: 49 mg/dL — ABNORMAL HIGH (ref 8–23)
CO2: 20 mmol/L — ABNORMAL LOW (ref 22–32)
Calcium: 8.1 mg/dL — ABNORMAL LOW (ref 8.9–10.3)
Chloride: 93 mmol/L — ABNORMAL LOW (ref 98–111)
Creatinine, Ser: 2.35 mg/dL — ABNORMAL HIGH (ref 0.44–1.00)
GFR, Estimated: 22 mL/min — ABNORMAL LOW (ref >60)
Glucose, Bld: 155 mg/dL — ABNORMAL HIGH (ref 70–99)
Potassium: 4.1 mmol/L (ref 3.5–5.1)
Sodium: 124 mmol/L — ABNORMAL LOW (ref 135–145)
Total Bilirubin: 0.7 mg/dL (ref 0.3–1.2)
Total Protein: 5.9 g/dL — ABNORMAL LOW (ref 6.5–8.1)

## 2020-12-09 LAB — RAPID URINE DRUG SCREEN, HOSP PERFORMED
Amphetamines: NOT DETECTED
Barbiturates: NOT DETECTED
Benzodiazepines: NOT DETECTED
Cocaine: NOT DETECTED
Opiates: NOT DETECTED
Tetrahydrocannabinol: NOT DETECTED

## 2020-12-09 LAB — LACTIC ACID, PLASMA
Lactic Acid, Venous: 2.7 mmol/L (ref 0.5–1.9)
Lactic Acid, Venous: 2.4 mmol/L (ref 0.5–1.9)
Lactic Acid, Venous: 1.5 mmol/L (ref 0.5–1.9)

## 2020-12-09 LAB — GLUCOSE, CAPILLARY
Glucose-Capillary: 139 mg/dL — ABNORMAL HIGH (ref 70–99)
Glucose-Capillary: 157 mg/dL — ABNORMAL HIGH (ref 70–99)
Glucose-Capillary: 139 mg/dL — ABNORMAL HIGH (ref 70–99)
Glucose-Capillary: 142 mg/dL — ABNORMAL HIGH (ref 70–99)

## 2020-12-09 LAB — PHENYTOIN LEVEL, TOTAL: Phenytoin Lvl: 4.5 ug/mL — ABNORMAL LOW (ref 10.0–20.0)

## 2020-12-09 LAB — OCCULT BLOOD X 1 CARD TO LAB, STOOL: Fecal Occult Bld: POSITIVE — AB

## 2020-12-09 LAB — CREATININE, SERUM
Creatinine, Ser: 2.53 mg/dL — ABNORMAL HIGH (ref 0.44–1.00)
GFR, Estimated: 20 mL/min — ABNORMAL LOW (ref >60)

## 2020-12-09 LAB — OSMOLALITY, URINE: Osmolality, Ur: 199 mOsm/kg — ABNORMAL LOW (ref 300–900)

## 2020-12-09 LAB — OSMOLALITY: Osmolality: 283 mOsm/kg (ref 275–295)

## 2020-12-09 LAB — MAGNESIUM: Magnesium: 1.6 mg/dL — ABNORMAL LOW (ref 1.7–2.4)

## 2020-12-09 LAB — SODIUM, URINE, RANDOM: Sodium, Ur: 32 mmol/L

## 2020-12-09 MED ORDER — CHLORHEXIDINE GLUCONATE 0.12 % MT SOLN
15.0000 mL | Freq: Two times a day (BID) | OROMUCOSAL | Status: DC
  Administered 2020-12-09 – 2020-12-15 (×9): 15 mL via OROMUCOSAL
  Filled 2020-12-09 (×9): qty 15

## 2020-12-09 MED ORDER — MAGNESIUM SULFATE 2 GM/50ML IV SOLN
2.0000 g | Freq: Once | INTRAVENOUS | Status: AC
  Administered 2020-12-09: 2 g via INTRAVENOUS
  Filled 2020-12-09: qty 50

## 2020-12-09 MED ORDER — CHLORHEXIDINE GLUCONATE CLOTH 2 % EX PADS
6.0000 | MEDICATED_PAD | Freq: Every day | CUTANEOUS | Status: DC
  Administered 2020-12-09 – 2020-12-15 (×8): 6 via TOPICAL

## 2020-12-09 MED ORDER — ORAL CARE MOUTH RINSE
15.0000 mL | Freq: Two times a day (BID) | OROMUCOSAL | Status: DC
  Administered 2020-12-09: 15 mL via OROMUCOSAL

## 2020-12-09 MED ORDER — SODIUM CHLORIDE 0.9 % IV BOLUS
1000.0000 mL | Freq: Once | INTRAVENOUS | Status: AC
  Administered 2020-12-09: 1000 mL via INTRAVENOUS

## 2020-12-09 MED ORDER — ORAL CARE MOUTH RINSE
15.0000 mL | Freq: Two times a day (BID) | OROMUCOSAL | Status: DC
  Administered 2020-12-10 (×2): 15 mL via OROMUCOSAL

## 2020-12-09 NOTE — Progress Notes (Signed)
Attending:    Subjective: Admitted from SNF/ALF facility with failure to thrive, confusion yesterday, noted to be hypotensive WBC elevated, in shock, Na 124, Cr 3.1, Lactic acid slightly elevated Admitted to ICU for vasopressors, antibiotics in the setting of bilateral pyelonephritis Imaging worrisome for bilateral lower lobe pneumonia  PMH Dementia Hypertension Hyeprlipidemia Seizure disorder History of stroke Vit D defiency  Objective: Vitals:   12/09/20 0753 12/09/20 0800 12/09/20 0900 12/09/20 1000  BP:  (!) 82/66 (!) 85/61 (!) 82/59  Pulse:  93 89 88  Resp:  17 17 18   Temp: 97.8 F (36.6 C)     TempSrc: Axillary     SpO2:  97% 98% 98%  Weight:          Intake/Output Summary (Last 24 hours) at 12/09/2020 1039 Last data filed at 12/09/2020 1000 Gross per 24 hour  Intake 5251.92 ml  Output 1176 ml  Net 4075.92 ml    General:  Frail, chronically ill appearing, resting comfortably in bed HENT: NCAT OP clear PULM: CTA B, normal effort CV: RRR, no mgr GI: BS+, soft, nontender MSK: diminished bulk and tone Neuro: asleep, minimally responsive to touch, no response to voice for me    CBC    Component Value Date/Time   WBC 55.6 (HH) 12/09/2020 0157   RBC 3.42 (L) 12/09/2020 0157   HGB 9.8 (L) 12/09/2020 0157   HCT 27.9 (L) 12/09/2020 0157   PLT 452 (H) 12/09/2020 0157   MCV 81.6 12/09/2020 0157   MCH 28.7 12/09/2020 0157   MCHC 35.1 12/09/2020 0157   RDW 13.9 12/09/2020 0157   LYMPHSABS 0.0 (L) 12/09/2020 0157   MONOABS 1.7 (H) 12/09/2020 0157   EOSABS 0.0 12/09/2020 0157   BASOSABS 0.0 12/09/2020 0157    BMET    Component Value Date/Time   NA 124 (L) 12/09/2020 0157   K 4.1 12/09/2020 0157   CL 93 (L) 12/09/2020 0157   CO2 20 (L) 12/09/2020 0157   GLUCOSE 155 (H) 12/09/2020 0157   BUN 49 (H) 12/09/2020 0157   CREATININE 2.35 (H) 12/09/2020 0157   CALCIUM 8.1 (L) 12/09/2020 0157   GFRNONAA 22 (L) 12/09/2020 0157   GFRAA >60 04/20/2019 1211     CXR images 10/7 > clear lung fields bilaterally  Impression/Plan: Septic shock from non-obstructive pyelonephritis> remains in shock with elevated leukocytosis; plan d/c vanc, f/u blood and urine culture, continue cefepime, continue levophed, LR at 150cc/hr Hyponatremia> f/u urine studies, continue fluid resuscitation, monitor BMET Acute on chronic metabolic encephalopathy from sepsis in setting of severe underlying dementia> hold any sedating medications  Goals of care: overall prognosis is poor due to advanced dementia and now with severe multi-organ failure from septic shock.  Discussed with nieces bedside, plan for palliative care conversation today.  My cc time 31 minutes  12/7, MD Luis M. Cintron PCCM Pager: [phone] Cell: [phone] After 7pm: [phone]

## 2020-12-09 NOTE — Progress Notes (Signed)
NAME:  Casey Edwards, MRN:  [id number], DOB:  1955/08/29, LOS: 1 ADMISSION DATE:  12/08/2020, CONSULTATION DATE:  10/7 REFERRING MD:  Dr. Audley Hose, CHIEF COMPLAINT:  Urosepsis   History of Present Illness:  Patient is a 65 year old female with pertinent past medical history of CVA, dementia, seizures, HTN, HLD, encephalopathy, right-sided hemiparesis, MDD presents to [facility] ED on 10/7 with AMS and hypotension.  According to family, patient can typically speak and state name but today has been obtunded.  Patient has been living in assisted living since earlier this year.  She was recently admitted in June 2022 with UTI.   Upon arrival to ED on 10/7 Foley catheter was placed with dark cloudy urine.  Urinalysis indicative of UTI.  Patient remains altered and not responding to sternal rub.  Patient was on room air with sats 99%.  White blood cell 55, hemoglobin 9.8.  NA 124, BUN 55, creatinine 3.14.  Initial lactic acid 2.6 now 3.3.  CXR with no significant findings.  CT head negative for acute abnormality; atrophy and chronic small vessel ischemic changes of white matter.  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  Patient was started on cefepime, Vanco, metronidazole.  Patient given 2 L boluses of fluid and still remained hypotensive and required low-dose levo to be started to keep MAP greater than 65.  Since starting antibiotics patient's mental status is slowly starting to improve with patient opening eyes to command but still not speaking and following commands.  PCCM consulted for ICU admission and medical management.  Pertinent  Medical History   Past Medical History:  Diagnosis Date    Hypercholesteremia    Hypertension    Seizures (HCC)    Stroke [facility])    Vitamin D deficiency      Significant Hospital Events: Including procedures, antibiotic start and stop dates in addition to other pertinent events   10/7: admitted to [facility] ICU; on 2 mcg Levo; received 2L bolus  Interim History / Subjective:  Patient is moaning to any questions asked and shuts her eyes tighter when I try to open then. Not speaking or following commands. Patient on 4 MCG of levo and continuous fluids with map in the 70s. Patient on room air with sats 99%  Objective   Blood pressure 94/67, pulse 93, temperature 97.7 F (36.5 C), temperature source Oral, resp. rate 17, weight 55 kg, SpO2 97 %.        Intake/Output Summary (Last 24 hours) at 12/09/2020 0742 Last data filed at 12/09/2020 0641 Gross per 24 hour  Intake 4188.85 ml  Output 1176 ml  Net 3012.85 ml   Filed Weights   12/09/20 0102  Weight: 55 kg    Examination: General:  elderly and ill appearing HEENT: shuts eyes tight when tried to assess, unable to assess mucous membranes Neuro: not following commands or speaking but moans when asked questions CV: s1s2, RRR, no m/r/g PULM:  dim clear BS bilaterally; RA sats 99% GI: soft, bsx4 active; tender to palpation of lower abdomen Extremities: warm/dry, no edema  Skin: no rashes or lesions, normal skin turgor  Resolved Hospital Problem list     Assessment & Plan:  Septic shock 2/2 UTI with possible pyelonephritis Admitted to ICU for vasopressors  in the setting of urosepsis.  CT abdomen shows possible bilateral pyelonephritis and a 10 mm bladder calculus.  On levophed, MAP in 70s.  Lactic acid remains slightly elevated. Leukocytosis remains severe, 55. Discontinue vancomycin, MRSA negative.  Plan to continue cefepime, IV fluids and wean pressor support as tolerated.  Palliative care plans to meet with family this afternoon for goals of care discussion. -continue IV fluids  -continue  to wean levo for MAP >65 -Trend fever/WBC curve -Trend lactate -Follow urine and blood culture -Continue cefepime  -Strict I&O  Acute metabolic encephalopathy Hx of CVA Hx of seizures Remains altered, not following commands.  Able to state name in and speak few words at baseline. -Avoid sedating medicines -Continue home seizure meds: Phenytoin, Vimpat, Keppra -consider starting plavix when able to take p.o.  AKI Likely in the setting of urosepsis and decreased renal perfusion.  Creatinine slightly improved, 2.35 from 2.53.  Baseline 0.5-0.9. -Trend BMP  -Avoid nephrotoxic agents  Hyponatremia Sodium 124, unchanged. Suspect SIADH given no improvement with IVF. Check urine studies. -Trend BMP  Acute on chronic anemia Hemoglobin stable at 9.8. -Trend CBC -Transfuse for hemoglobin less than 7  Hx of HTN and HLD -Hold home meds while hypotensive -Consider starting statin when able to take meds p.o.  Hx of MDD -Consider starting home Zoloft when able to take meds p.o.  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 thickening of distal esophagus with questionable esophagitis uterine fibroid 3.8 cm diameter -PPI for questionable esophagitis -No further work-up indicated for additional findings  Best Practice (right click and "Reselect all SmartList Selections" daily)   Diet/type: NPO DVT prophylaxis: prophylactic heparin  GI prophylaxis: PPI Lines: N/A Foley:  Yes, and it is still needed Code Status:  DNR Last date of multidisciplinary goals of care discussion: 10/8, daughter is driving down from [place] today will be here after 5 PM will have a goals of care discussion along with palliative care at that time.  Patient made DNR on 10/7  Labs   CBC: Recent Labs  Lab 12/08/20 1702 12/08/20 2236 12/09/20 0157  WBC 55.4* 54.3* 55.6*  NEUTROABS 52.6*  --  53.9*  HGB 9.8* 10.8* 9.8*  HCT 28.5* 31.0* 27.9*  MCV  81.9 82.2 81.6  PLT 429* 409* 452*    Basic Metabolic Panel: Recent Labs  Lab 12/08/20 1702 12/08/20 2236 12/09/20 0157  NA 124*  --  124*  K 4.1  --  4.1  CL 92*  --  93*  CO2 18*  --  20*  GLUCOSE 103*  --  155*  BUN 55*  --  49*  CREATININE 3.14* 2.53* 2.35*  CALCIUM 8.1*  --  8.1*  MG  --   --  1.6*   GFR: Estimated Creatinine Clearance: 19.5 mL/min (A) (by C-G formula based on SCr of 2.35 mg/dL (H)). Recent Labs  Lab 12/08/20 1702 12/08/20 1902 12/08/20 2236 12/09/20 0014 12/09/20 0157  WBC 55.4*  --  54.3*  --  55.6*  LATICACIDVEN 2.6* 3.3*  --  2.7* 2.4*    Liver Function Tests: Recent Labs  Lab 12/08/20 1702 12/09/20 0157  AST 34 35  ALT 23 23  ALKPHOS 102 112  BILITOT 0.9 0.7  PROT 6.1* 5.9*  ALBUMIN 1.6* 1.5*   No results for input(s): LIPASE, AMYLASE in the last 168 hours. No results for input(s): AMMONIA in the last 168 hours.  ABG  Component Value Date/Time   TCO2 24 08/24/2020 0841     Coagulation Profile: Recent Labs  Lab 12/08/20 1702  INR 1.4*    Cardiac Enzymes: No results for input(s): CKTOTAL, CKMB, CKMBINDEX, TROPONINI in the last 168 hours.  HbA1C: Hgb A1c MFr Bld  Date/Time Value Ref Range Status  08/25/2020 06:04 AM 5.0 4.8 - 5.6 % Final    Comment:    (NOTE) Pre diabetes:          5.7%-6.4%  Diabetes:              >6.4%  Glycemic control for   <7.0% adults with diabetes     CBG: Recent Labs  Lab 12/09/20 0515 12/09/20 0739  GLUCAP 139* 157*    Review of Systems:   Unable to obtain. Patient with AMS and not following commands.  Past Medical History:  She,  has a past medical history of Hypercholesteremia, Hypertension, Seizures (HCC), Stroke (HCC), and Vitamin D deficiency.   Surgical History:  History reviewed. No pertinent surgical history.   Social History:   reports that she has never smoked. She has never used smokeless tobacco. She reports that she does not drink alcohol and does not use  drugs.   Family History:  Her family history is not on file.   Allergies No Known Allergies   Home Medications  Prior to Admission medications   Medication Sig Start Date End Date Taking? Authorizing Provider  acetaminophen (TYLENOL) 500 MG tablet Take 1,000 mg by mouth daily.   Yes [provider]  amLODipine (NORVASC) 5 MG tablet Take 5 mg by mouth daily.   Yes [provider]  baclofen (LIORESAL) 10 MG tablet Take 0.5 tablets (5 mg total) by mouth 2 (two) times daily as needed for muscle spasms. 08/30/20  Yes Merrilyn Puma, MD  Cholecalciferol (VITAMIN D3) 50 MCG (2000 UT) TABS Take 2,000 Units by mouth daily.   Yes [provider]  clopidogrel (PLAVIX) 75 MG tablet Take 75 mg by mouth daily.   Yes [provider]  diclofenac Sodium (VOLTAREN) 1 % GEL Apply 4 g topically 3 (three) times daily. Apply topically to bilateral knees. 08/30/20  Yes Merrilyn Puma, MD  Ensure Plus (ENSURE PLUS) LIQD Take 237 mLs by mouth 2 (two) times daily. Strawberry flavor   Yes [provider]  ferrous sulfate 325 (65 FE) MG tablet Take 325 mg by mouth daily.   Yes [provider]  lacosamide (VIMPAT) 200 MG TABS tablet Take 1 tablet (200 mg total) by mouth 2 (two) times daily. 08/30/20  Yes Merrilyn Puma, MD  levETIRAcetam (KEPPRA) 750 MG tablet Take 2 tablets (1,500 mg total) by mouth 2 (two) times daily. 08/30/20  Yes Merrilyn Puma, MD  magnesium hydroxide (MILK OF MAGNESIA) 400 MG/5ML suspension Take 30 mLs by mouth daily as needed for mild constipation.   Yes [provider]  Multiple Vitamins-Minerals (MULTIVITAMIN WITH MINERALS) tablet Take 1 tablet by mouth daily.   Yes [provider]  Omega-3 Fatty Acids (FISH OIL) 1000 MG CAPS Take 2,000 mg by mouth 2 (two) times daily.   Yes [provider]  phenytoin (DILANTIN) 100 MG ER capsule Take 1 capsule (100 mg total) by mouth 3 (three) times daily. 08/30/20  Yes Merrilyn Puma,  MD  rosuvastatin (CRESTOR) 20 MG tablet Take 1 tablet (20 mg total) by mouth at bedtime. 08/30/20  Yes Merrilyn Puma, MD  sertraline (ZOLOFT) 100 MG tablet Take 50 mg by mouth daily.  Yes [provider]  tamsulosin (FLOMAX) 0.4 MG CAPS capsule Take 0.4 mg by mouth daily.   Yes [provider]     Lerry Liner, DO PGY-3 IMTS

## 2020-12-09 NOTE — Progress Notes (Signed)
Patient arrived to 3M09 @0056  from the Emergency Department. Responds to Voice/Pain, not following commands. From [facility] with significant history of Dementia. Wound/ Fungal Growth (?) to left heel, foam applied. Preventative foam applied to left scapula and sacrum. Skin breakdown along R. Ischial Tuberosity with non-blanchable areas, foam applied. WOC consult placed.

## 2020-12-09 NOTE — Consult Note (Signed)
Palliative Medicine Inpatient Consult Note  Consulting Provider: Placido Sou, PA-C  Reason for consult:   Palliative Care Consult Services Palliative Medicine Consult  Reason for Consult? Patient presents with septic shock.  DNR/DNI.  Patient's daughter would like [place] to continue moving forward with IV fluids as well as vasopressors if needed but has requested that patient remain DNR/DNI.  Would like to involve palliative care in her case.   HPI:  Per intake H&P --> Patient is a 65 year old female with pertinent past medical history of CVA, dementia, seizures, HTN, HLD, encephalopathy, right-sided hemiparesis, MDD presents to [facility] ED on 10/7 with AMS and hypotension.  Palliative care has been asked to get involved in the setting of critical illness to introduce Palliative support and further address goals of care.  Clinical Assessment/Goals of Care:  *Please note that this is a verbal dictation therefore any spelling or grammatical errors are due to the "Dragon Medical One" system interpretation.  I have reviewed medical records including EPIC notes, labs and imaging, received report from bedside RN, assessed the patient who is resting comfortably on room air and does not appear to exemplify any nonverbal signs of distress.    I called patient's daughter, Jilliana Burkes to further discuss diagnosis prognosis, GOC, EOL wishes, disposition and options.  Karin Golden shares with me that her mother had a rather large stroke in 2014 leaving her with right-sided weakness and hemiparesis.  We discussed that since that time Neelie has required additional care at a home.   I introduced Palliative Medicine as specialized medical care for people living with serious illness. It focuses on providing relief from the symptoms and stress of a serious illness. The goal is to improve quality of life for both the patient and the family.  Liyah is originally from [place].  She is divorced.  She has a  son and a daughter. Her daughter, Karin Golden  lives in [place] and her son who travels as part of the [employer].  Delia herself is former [employer].  Prior to her large stroke in 2014 sharing needs to get joy at of being social making new friends and drinking alcohol.  She is not overtly religious.  Manuela is lived at [facility] where they do provide total care for basic activities of daily living.  She has expressive aphasia and her daughter shares that sometimes it can be hard to discern if her yes no answers are even accurate.  A detailed discussion was had today regarding advanced directives.  Concepts specific to code status, artifical feeding and hydration, continued IV antibiotics and rehospitalization was had.  Patient is DO NOT RESUSCITATE/DO NOT INTUBATE CODE STATUS.  A review of patient's admission diagnosis was had.  We reviewed the significance of septic shock and the other organ systems which have been affected secondary to this and Naiyah's case.  We discussed that this is something that can lead to increased mortality.  We discussed that Prarthana's inciting source of infection was likely urinary possibly in the setting of a bladder stone.  The difference between a aggressive medical intervention path  and a palliative comfort care path for this patient at this time was had.  We did review the best case and worst-case scenarios considering the severity of Scharer and sepsis.  We reviewed that in the best case scenario she will wean off pressors she will maintain a level of stability and be able to transition back to [facility].  We reviewed that the worst case  scenario would be Labrittany could not wean off of pressor therapy would have continued kidney decline and we would need to further discuss modalities of providing comfort care.  Presently patient's daughter, Karin Golden expresses cautious optimism.  She shares with me that she "gets it" and realizes that her mother is right now extremely  critically ill.  We reviewed that at this point we are just taking her situation one moment at a time to see which way things will go.  Karin Golden plans to travel down from [place] today and should arrive at the hospital around 5 PM.  Discussed the importance of continued conversation with family and their  medical providers regarding overall plan of care and treatment options, ensuring decisions are within the context of the patients values and GOCs.  Decision Maker: Olena Willy (daughter) - [phone]  SUMMARY OF RECOMMENDATIONS   DNAR/DNI  Patient's daughter. Karin Golden is cautiously optimistic for improvements  If patient continues to decline or is unable to wean off of pressor therapy patient's daughter is aware that we may need to further discuss comfort mediated care  Ongoing palliative support be provided  Code Status/Advance Care Planning: DNAR/DNI   Palliative Prophylaxis:  Oral care, turn every 2 hours  Additional Recommendations (Limitations, Scope, Preferences): Continue to treat what is treatable   Psycho-social/Spiritual:  Desire for further Chaplaincy support: No Additional Recommendations: Education on the significance of septic shock   Prognosis: Unclear presently  Discharge Planning: If patient is optimized will transition back to [facility]  Vitals:   12/09/20 0430 12/09/20 0445  BP: 95/69 97/66  Pulse: 92 91  Resp: (!) 21 19  Temp:    SpO2: 98% 97%    Intake/Output Summary (Last 24 hours) at 12/09/2020 2952 Last data filed at 12/09/2020 0400 Gross per 24 hour  Intake 4188.85 ml  Output 651 ml  Net 3537.85 ml   Last Weight  Most recent update: 12/09/2020  1:03 AM    Weight  55 kg (121 lb 4.1 oz)            Gen: Frail elderly female in no acute distress HEENT: moist mucous membranes CV: Regular rate and rhythm PULM: On room air, clear to auscultation bilaterally ABD: soft/nontender EXT: No edema Neuro: Opens eyes not following  commands this morning  PPS: 10%   This conversation/these recommendations were discussed with patient primary care team, Dr. Karilyn Cota  Time In: 0700 Time Out:0810 Total Time: 70 Greater than 50%  of this time was spent counseling and coordinating care related to the above assessment and plan.  Lamarr Lulas [place] Palliative Medicine Team Team Cell Phone: [phone] Please utilize secure chat with additional questions, if there is no response within 30 minutes please call the above phone number  Palliative Medicine Team providers are available by phone from 7am to 7pm daily and can be reached through the team cell phone.  Should this patient require assistance outside of these hours, please call the patient's attending physician.

## 2020-12-09 NOTE — Progress Notes (Signed)
eLink Physician-Brief Progress Note Patient Name: ATALYA DANO DOB: May 15, 1955 MRN: [id number]   Date of Service  12/09/2020  HPI/Events of Note  Multiple issues: 1. Hypomagnesemia - Mg++ = 1.6 and Creatinine = 2.35. 2. Lactic Acid = 2.7 --> 2.4.   eICU Interventions  Plan: Will replace Mg++. Bolus with 0.9 NaCl 1 liter IV over 1 hour now.     Intervention Category Major Interventions: Electrolyte abnormality - evaluation and management;Acid-Base disturbance - evaluation and management  Keigan Girten [place] 12/09/2020, 3:45 AM

## 2020-12-09 NOTE — Sepsis Progress Note (Signed)
Notified bedside nurse of need to draw repeat lactic acid. 

## 2020-12-09 NOTE — ED Notes (Signed)
Report given to rn on 3m 

## 2020-12-10 DIAGNOSIS — A419 Sepsis, unspecified organism: Secondary | ICD-10-CM | POA: Diagnosis not present

## 2020-12-10 DIAGNOSIS — N12 Tubulo-interstitial nephritis, not specified as acute or chronic: Secondary | ICD-10-CM | POA: Diagnosis not present

## 2020-12-10 DIAGNOSIS — Z515 Encounter for palliative care: Secondary | ICD-10-CM | POA: Diagnosis not present

## 2020-12-10 DIAGNOSIS — R6521 Severe sepsis with septic shock: Secondary | ICD-10-CM | POA: Diagnosis not present

## 2020-12-10 LAB — CBC WITH DIFFERENTIAL/PLATELET
Abs Immature Granulocytes: 1.29 10*3/uL — ABNORMAL HIGH (ref 0.00–0.07)
Basophils Absolute: 0.1 10*3/uL (ref 0.0–0.1)
Basophils Relative: 0 %
Eosinophils Absolute: 0.2 10*3/uL (ref 0.0–0.5)
Eosinophils Relative: 0 %
HCT: 26.6 % — ABNORMAL LOW (ref 36.0–46.0)
Hemoglobin: 9.5 g/dL — ABNORMAL LOW (ref 12.0–15.0)
Immature Granulocytes: 3 %
Lymphocytes Relative: 2 %
Lymphs Abs: 1 10*3/uL (ref 0.7–4.0)
MCH: 28.1 pg (ref 26.0–34.0)
MCHC: 35.7 g/dL (ref 30.0–36.0)
MCV: 78.7 fL — ABNORMAL LOW (ref 80.0–100.0)
Monocytes Absolute: 1.8 10*3/uL — ABNORMAL HIGH (ref 0.1–1.0)
Monocytes Relative: 4 %
Neutro Abs: 38.9 10*3/uL — ABNORMAL HIGH (ref 1.7–7.7)
Neutrophils Relative %: 91 %
Platelets: 434 10*3/uL — ABNORMAL HIGH (ref 150–400)
RBC: 3.38 MIL/uL — ABNORMAL LOW (ref 3.87–5.11)
RDW: 13.9 % (ref 11.5–15.5)
WBC: 43.3 10*3/uL — ABNORMAL HIGH (ref 4.0–10.5)
nRBC: 0 % (ref 0.0–0.2)

## 2020-12-10 LAB — BASIC METABOLIC PANEL
Anion gap: 10 (ref 5–15)
BUN: 44 mg/dL — ABNORMAL HIGH (ref 8–23)
CO2: 20 mmol/L — ABNORMAL LOW (ref 22–32)
Calcium: 8.1 mg/dL — ABNORMAL LOW (ref 8.9–10.3)
Chloride: 101 mmol/L (ref 98–111)
Creatinine, Ser: 1.5 mg/dL — ABNORMAL HIGH (ref 0.44–1.00)
GFR, Estimated: 38 mL/min — ABNORMAL LOW (ref >60)
Glucose, Bld: 164 mg/dL — ABNORMAL HIGH (ref 70–99)
Potassium: 3.3 mmol/L — ABNORMAL LOW (ref 3.5–5.1)
Sodium: 131 mmol/L — ABNORMAL LOW (ref 135–145)

## 2020-12-10 LAB — GLUCOSE, CAPILLARY
Glucose-Capillary: 115 mg/dL — ABNORMAL HIGH (ref 70–99)
Glucose-Capillary: 119 mg/dL — ABNORMAL HIGH (ref 70–99)

## 2020-12-10 MED ORDER — SODIUM CHLORIDE 0.9 % IV SOLN: 250.0000 mL | INTRAVENOUS | Status: DC

## 2020-12-10 MED ORDER — POTASSIUM CHLORIDE 10 MEQ/100ML IV SOLN
10.0000 meq | INTRAVENOUS | Status: AC
  Administered 2020-12-10 (×6): 10 meq via INTRAVENOUS
  Filled 2020-12-10 (×6): qty 100

## 2020-12-10 MED ORDER — NOREPINEPHRINE 4 MG/250ML-% IV SOLN
2.0000 ug/min | INTRAVENOUS | Status: DC
  Administered 2020-12-10: 4 ug/min via INTRAVENOUS

## 2020-12-10 MED ORDER — LIP MEDEX EX OINT
TOPICAL_OINTMENT | CUTANEOUS | Status: DC | PRN
  Administered 2020-12-10: 1 via TOPICAL
  Filled 2020-12-10: qty 7

## 2020-12-10 MED ORDER — LACTATED RINGERS IV SOLN: INTRAVENOUS | Status: AC

## 2020-12-10 NOTE — Evaluation (Signed)
Clinical/Bedside Swallow Evaluation Patient Details  Name: Casey Edwards MRN: [id number] Date of Birth: [birth year]  Today's Date: 12/10/2020 Time: SLP Start Time (ACUTE ONLY): 1130 SLP Stop Time (ACUTE ONLY): 1145 SLP Time Calculation (min) (ACUTE ONLY): 15 min  Past Medical History:  Past Medical History:  Diagnosis Date   Hypercholesteremia    Hypertension    Seizures (HCC)    Stroke (HCC)    Vitamin D deficiency    Past Surgical History: History reviewed. No pertinent surgical history. HPI:  Patient is a 65 y.o. female with PMH: CVA, dementia, seizures, HTN, HLD, encephalopathy, right sided hemiparesis, MDD who presents to [facility] ED from SNF on 10/7 with AMS and hypotension. CT head negative for acute abnormality. CXR did not show any active disease however CT abdomen pelvis had findings of Subsegmental atelectasis at both lung bases. Additional questionable area of infiltrate in LEFT lower lobe.    Assessment / Plan / Recommendation  Clinical Impression  Patient presents with clinical s/s of dysphagia without observed coughing or throat clearing. She exhibited decreased awareness to PO's initially, but was able to hold cup and give self sips of water after SLP setup. She was not able to utilize spoon but accepted bites of puree (applesauce) when offered. Mild amount of puree solid residue in oral cavity during PO intake however when SLP examined oral cavity after PO intake completed, no oral residuals observed. SLP is recommending to initiate Dys 1, thin liquids at this time and will follow patient for toleration and ability to upgrade solid textures. SLP Visit Diagnosis: Dysphagia, unspecified (R13.10)    Aspiration Risk  Mild aspiration risk    Diet Recommendation Dysphagia 1 (Puree);Thin liquid   Liquid Administration via: Cup Medication Administration: Crushed with puree Supervision: Full supervision/cueing for compensatory strategies;Staff to assist with self  feeding Compensations: Slow rate;Small sips/bites Postural Changes: Seated upright at 90 degrees    Other  Recommendations Oral Care Recommendations: Oral care BID;Staff/trained caregiver to provide oral care    Recommendations for follow up therapy are one component of a multi-disciplinary discharge planning process, led by the attending physician.  Recommendations may be updated based on patient status, additional functional criteria and insurance authorization.  Follow up Recommendations 24 hour supervision/assistance;Skilled Nursing facility      Frequency and Duration min 1 x/week  1 week       Prognosis Prognosis for Safe Diet Advancement: Fair Barriers to Reach Goals: Time post onset;Cognitive deficits      Swallow Study   General Date of Onset: 12/10/20 HPI: Patient is a 65 y.o. female with PMH: CVA, dementia, seizures, HTN, HLD, encephalopathy, right sided hemiparesis, MDD who presents to [facility] ED from SNF on 10/7 with AMS and hypotension. CT head negative for acute abnormality. CXR did not show any active disease however CT abdomen pelvis had findings of Subsegmental atelectasis at both lung bases. Additional questionable area of infiltrate in LEFT lower lobe. Type of Study: Bedside Swallow Evaluation Previous Swallow Assessment: During previous hospitalization 6/24 BSE, was advanced to Dys 3 solids, thin liquids Diet Prior to this Study: NPO Temperature Spikes Noted: No Respiratory Status: Room air History of Recent Intubation: No Behavior/Cognition: Alert;Cooperative;Requires cueing Oral Cavity Assessment: Within Functional Limits Oral Care Completed by SLP: Yes Oral Cavity - Dentition: Edentulous Vision: Functional for self-feeding Self-Feeding Abilities: Needs assist;Needs set up;Other (Comment) (able to hold cup and give self sips, unable to manage spoon) Patient Positioning: Upright in bed Baseline Vocal Quality: Not observed Volitional Cough:  Cognitively unable  to elicit Volitional Swallow: Unable to elicit    Oral/Motor/Sensory Function Overall Oral Motor/Sensory Function: Within functional limits   Ice Chips     Thin Liquid Thin Liquid: Impaired Presentation: Cup;Straw Oral Phase Impairments: Reduced labial seal Other Comments: unable to achieve adequate labial seal to use straw, one instance of labial spillage during cup sip    Nectar Thick     Honey Thick     Puree Puree: Impaired Oral Phase Impairments: Reduced labial seal Oral Phase Functional Implications: Prolonged oral transit;Other (comment) Other Comments: brief oral holding/residuals but fully cleared when SLP checked oral cavity after PO intake   Solid     Solid: Not tested      Angela Nevin, MA, CCC-SLP Speech Therapy

## 2020-12-10 NOTE — Progress Notes (Addendum)
Palliative Medicine Inpatient Follow Up Note  Consulting Provider: Moody Edwards   Reason for consult:   Casey Edwards Palliative Medicine Consult  Reason for Consult? Patient presents with septic shock.  DNR/DNI.  Patient's daughter would like [place] to continue moving forward with IV fluids as well as vasopressors if needed but has requested that patient remain DNR/DNI.  Would like to involve palliative care in her case.    HPI:  Per intake H&P --> Patient is a 65 year old female with pertinent past medical history of CVA, dementia, seizures, HTN, HLD, encephalopathy, right-sided hemiparesis, MDD presents to [facility] ED on 10/7 with AMS and hypotension.   Palliative care has been asked to get involved in the setting of critical illness to introduce Palliative support and further address goals of care.  Today's Discussion (12/10/2020):  *Please note that this is a verbal dictation therefore any spelling or grammatical errors are due to the "[employer]" system interpretation.  Chart reviewed.  I met with bedside RN, Casey Edwards this morning. He and I reviewed that patients pressors had been increased for her MAP. Otherwise she has remained about the same.  Per assessment, Casey Edwards is somnolent while at bedside. She does not appear to have any nonverbal indicators of  distress.   I called patients daughter, Casey Edwards. We reviewed that presently, Casey Edwards is too lethargic to eat or drink on her own presently though when more alert a swallow evaluation will be pursued. We discussed that she is getting hydration through her IV. We discussed that patient is still in a tenuous position and presently it is unclear how well she will or will not do overall. Patients daughter plans to visit later this morning and was thankful for these updates.  ________________________________________________________________ Addendum:  I met with patients daughter, Casey Edwards at bedside this late  morning. She expresses that her mother appears to be in better condition that she had originally thought. She expresses the hope for continued improvements and for her mother to transition back to [employer].  I shared that this is a wonderful thing to hope for. I reviewed that cautious optimism is reasonable though Casey Edwards is still very ill at this time. Casey Edwards acknowledged understanding of this.   Questions and concerns were addressed.  Palliative support was provided.  Objective Assessment: Vital Signs Vitals:   12/10/20 0800 12/10/20 0814  BP: 112/68   Pulse: 80   Resp: 14   Temp:  97.6 F (36.4 C)  SpO2: 98%     Intake/Output Summary (Last 24 hours) at 12/10/2020 7741 Last data filed at 12/10/2020 0800 Gross per 24 hour  Intake 2270.31 ml  Output 1475 ml  Net 795.31 ml   Last Weight  Most recent update: 12/10/2020  5:01 AM    Weight  58.5 kg (128 lb 15.5 oz)            Gen: Frail elderly female in no acute distress HEENT: moist mucous membranes CV: Regular rate and rhythm PULM: On room air, clear to auscultation bilaterally ABD: soft/tender in lower quadrants EXT: No edema Neuro: Opens eyes not following commands this morning  SUMMARY OF RECOMMENDATIONS   DNAR/DNI   Patient's daughter, Casey Edwards remains cautiously optimistic for improvements   If patient continues to decline or is unable to wean off of pressor therapy patient's daughter is aware that we may need to further discuss comfort mediated care   Ongoing palliative support be provided  Time Spent: 25 Greater than 50% of  the time was spent in counseling and coordination of care ______________________________________________________________________________________ [place] Team Team Cell Phone: [phone] Please utilize secure chat with additional questions, if there is no response within 30 minutes please call the above phone number  Palliative Medicine  Team providers are available by phone from 7am to 7pm daily and can be reached through the team cell phone.  Should this patient require assistance outside of these hours, please call the patient's attending physician.

## 2020-12-10 NOTE — Progress Notes (Signed)
NAME:  Casey Edwards, MRN:  [id number], DOB:  1955/03/13, LOS: 2 ADMISSION DATE:  12/08/2020, CONSULTATION DATE:  10/7 REFERRING MD:  Dr. Almyra Free, CHIEF COMPLAINT:  Urosepsis   History of Present Illness:  Patient is a 65 year old female with pertinent past medical history of CVA, dementia, seizures, HTN, HLD, encephalopathy, right-sided hemiparesis, MDD presents to [facility] ED on 10/7 with AMS and hypotension.  According to family, patient can typically speak and state name but today has been obtunded.  Patient has been living in assisted living since earlier this year.  She was recently admitted in June 2022 with UTI.   Upon arrival to ED on 10/7 Foley catheter was placed with dark cloudy urine.  Urinalysis indicative of UTI.  Patient remains altered and not responding to sternal rub.  Patient was on room air with sats 99%.  White blood cell 55, hemoglobin 9.8.  NA 124, BUN 55, creatinine 3.14.  Initial lactic acid 2.6 now 3.3.  CXR with no significant findings.  CT head negative for acute abnormality; atrophy and chronic small vessel ischemic changes of white matter.  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  Patient was started on cefepime, Vanco, metronidazole.  Patient given 2 L boluses of fluid and still remained hypotensive and required low-dose levo to be started to keep MAP greater than 65.  Since starting antibiotics patient's mental status is slowly starting to improve with patient opening eyes to command but still not speaking and following commands.  PCCM consulted for ICU admission and medical management.  Pertinent  Medical History   Past Medical History:  Diagnosis Date    Hypercholesteremia    Hypertension    Seizures ([place])    Stroke [facility])    Vitamin D deficiency     Significant Hospital Events: Including procedures, antibiotic start and stop dates in addition to other pertinent events   10/7: admitted to [facility] ICU; on 2 mcg Levo; received 2L bolus  Interim History / Subjective:  Patient opens eyes and more engaging overnight.  Still not verbally communicating.  Maps maintaining in the 70s on 9 mcg Levophed.  Saturating well on room air.  Objective   Blood pressure 95/66, pulse 75, temperature (!) 97.4 F (36.3 C), temperature source Axillary, resp. rate 15, weight 58.5 kg, SpO2 99 %.        Intake/Output Summary (Last 24 hours) at 12/10/2020 0745 Last data filed at 12/10/2020 0700 Gross per 24 hour  Intake 2262.72 ml  Output 1475 ml  Net 787.72 ml   Filed Weights   12/09/20 0102 12/10/20 0500  Weight: 55 kg 58.5 kg    Examination: General:  elderly and ill appearing HEENT: Moist membranes, PERRLA Neuro: Slightly more alert, not following commands, not verbally communicating CV: s1s2, RRR, no m/r/g PULM:  dim clear BS bilaterally; RA sats 99% GI: soft, bsx4 active; tender to palpation of lower abdomen Extremities: warm/dry, no edema  Skin: no rashes or lesions, normal skin turgor  Resolved Hospital Problem list     Assessment & Plan:  Septic shock 2/2 UTI with possible pyelonephritis Admitted to ICU for vasopressors in the setting of urosepsis.  CT abdomen shows possible bilateral pyelonephritis and a 10 mm bladder calculus.  On levophed, MAP  in 36s.  Lactic acid and leukocytosis improving.  On cefepime.  Blood culture no growth to date, urine culture showed multiple species, suggested recollection.  Palliative care met with daughter yesterday, current plan is to continue pressor support however if unable to wean may need to discuss further comfort mediated care. -continue IV fluids as needed -continue to wean levo for MAP >65 -Trend  fever/WBC curve -Continue cefepime  -Strict I&O  Acute metabolic encephalopathy Hx of CVA Hx of seizures Mentation improving as patient more alert overnight and engaged.  At baseline she only speaks approximately 10 words. -Avoid sedating medicines -Continue home seizure meds: Phenytoin, Vimpat, Keppra -consider starting plavix when able to take p.o.  AKI Likely in the setting of urosepsis and decreased renal perfusion.  Creatinine continues to improve, 1.5 from 2.35.  Baseline is below 1. -Trend BMP  -Avoid nephrotoxic agents  Hyponatremia Sodium improved at 131 from 124.  Serum osmolality normal however assessed after aggressive IV fluid resuscitation, urine osmolality and sodium low consistent with hypovolemia. -Trend BMP  Acute on chronic anemia Hemoglobin stable at 9.8. -Trend CBC -Transfuse for hemoglobin less than 7  Hx of HTN and HLD -Hold home meds while hypotensive -Consider starting statin when able to take meds p.o.  Hx of MDD -Consider starting home Zoloft when able to take meds p.o.  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  Labs   CBC: Recent Labs  Lab 12/08/20 1702 12/08/20 2236 12/09/20 0157 12/10/20 0258  WBC 55.4* 54.3* 55.6* 43.3*  NEUTROABS 52.6*  --  53.9* 38.9*  HGB 9.8* 10.8* 9.8* 9.5*  HCT 28.5* 31.0* 27.9* 26.6*  MCV 81.9 82.2 81.6 78.7*  PLT 429*  409* 452* 434*    Basic Metabolic Panel: Recent Labs  Lab 12/08/20 1702 12/08/20 2236 12/09/20 0157 12/10/20 0258  NA 124*  --  124* 131*  K 4.1  --  4.1 3.3*  CL 92*  --  93* 101  CO2 18*  --  20* 20*  GLUCOSE 103*  --  155* 164*  BUN 55*  --  49* 44*  CREATININE 3.14* 2.53* 2.35* 1.50*  CALCIUM 8.1*  --  8.1* 8.1*  MG  --   --  1.6*  --    GFR: Estimated Creatinine Clearance: 30.5 mL/min (A) (by C-G formula based on SCr of 1.5 mg/dL (H)). Recent Labs  Lab 12/08/20 1702 12/08/20 1902 12/08/20 2236 12/09/20 0014 12/09/20 0157 12/09/20 1143 12/10/20 0258  WBC 55.4*  --  54.3*  --  55.6*  --  43.3*  LATICACIDVEN 2.6* 3.3*  --  2.7* 2.4* 1.5  --     Liver Function Tests: Recent Labs  Lab 12/08/20 1702 12/09/20 0157  AST 34 35  ALT 23 23  ALKPHOS 102 112  BILITOT 0.9 0.7  PROT 6.1* 5.9*  ALBUMIN 1.6* 1.5*   No results for input(s): LIPASE, AMYLASE in the last 168 hours. No results for input(s): AMMONIA in the last 168  hours.  ABG    Component Value Date/Time   TCO2 24 08/24/2020 0841     Coagulation Profile: Recent Labs  Lab 12/08/20 1702  INR 1.4*    Cardiac Enzymes: No results for input(s): CKTOTAL, CKMB, CKMBINDEX, TROPONINI in the last 168 hours.  HbA1C: Hgb A1c MFr Bld  Date/Time Value Ref Range Status  08/25/2020 06:04 AM 5.0 4.8 - 5.6 % Final    Comment:    (NOTE) Pre diabetes:          5.7%-6.4%  Diabetes:              >6.4%  Glycemic control for   <7.0% adults with diabetes     CBG: Recent Labs  Lab 12/09/20 0515 12/09/20 0739 12/09/20 1123 12/09/20 1526  GLUCAP 139* 157* 139* 142*    Review of Systems:   Unable to obtain. Patient with AMS and not following commands.  Past Medical History:  She,  has a past medical history of Hypercholesteremia, Hypertension, Seizures ([place]), Stroke ([place]), and Vitamin D deficiency.   Surgical History:  History reviewed. No pertinent surgical history.   Social History:   reports  that she has never smoked. She has never used smokeless tobacco. She reports that she does not drink alcohol and does not use drugs.   Family History:  Her family history is not on file.   Allergies No Known Allergies   Home Medications  Prior to Admission medications   Medication Sig Start Date End Date Taking? Authorizing Provider  acetaminophen (TYLENOL) 500 MG tablet Take 1,000 mg by mouth daily.   Yes [provider]  amLODipine (NORVASC) 5 MG tablet Take 5 mg by mouth daily.   Yes [provider]  baclofen (LIORESAL) 10 MG tablet Take 0.5 tablets (5 mg total) by mouth 2 (two) times daily as needed for muscle spasms. 08/30/20  Yes Virl Axe, MD  Cholecalciferol (VITAMIN D3) 50 MCG (2000 UT) TABS Take 2,000 Units by mouth daily.   Yes [provider]  clopidogrel (PLAVIX) 75 MG tablet Take 75 mg by mouth daily.   Yes [provider]  diclofenac Sodium (VOLTAREN) 1 % GEL Apply 4 g topically 3 (three) times daily. Apply topically to bilateral knees. 08/30/20  Yes Virl Axe, MD  Ensure Plus (ENSURE PLUS) LIQD Take 237 mLs by mouth 2 (two) times daily. Strawberry flavor   Yes [provider]  ferrous sulfate 325 (65 FE) MG tablet Take 325 mg by mouth daily.   Yes [provider]  lacosamide (VIMPAT) 200 MG TABS tablet Take 1 tablet (200 mg total) by mouth 2 (two) times daily. 08/30/20  Yes Virl Axe, MD  levETIRAcetam (KEPPRA) 750 MG tablet Take 2 tablets (1,500 mg total) by mouth 2 (two) times daily. 08/30/20  Yes Virl Axe, MD  magnesium hydroxide (MILK OF MAGNESIA) 400 MG/5ML suspension Take 30 mLs by mouth daily as needed for mild constipation.   Yes [provider]  Multiple Vitamins-Minerals (MULTIVITAMIN WITH MINERALS) tablet Take 1 tablet by mouth daily.   Yes [provider]  Omega-3 Fatty Acids (FISH OIL) 1000 MG CAPS Take 2,000 mg by mouth 2 (two) times daily.   Yes [provider]   phenytoin (DILANTIN) 100 MG ER capsule Take 1 capsule (100 mg total) by mouth 3 (three) times daily. 08/30/20  Yes Virl Axe, MD  rosuvastatin (CRESTOR) 20 MG tablet Take 1 tablet (20 mg total) by mouth at bedtime. 08/30/20  Yes Virl Axe, MD  sertraline (ZOLOFT) 100 MG tablet Take 50 mg by mouth daily.   Yes [provider]  tamsulosin (FLOMAX) 0.4 MG CAPS capsule Take 0.4 mg by mouth daily.   Yes [provider]     Harlow Ohms, DO PGY-3 IMTS

## 2020-12-10 NOTE — Progress Notes (Signed)
[facility] ADULT ICU REPLACEMENT PROTOCOL   The patient does apply for the [facility] Adult ICU Electrolyte Replacment Protocol based on the criteria listed below:   1.Exclusion criteria: TCTS patients, ECMO patients and Hypothermia Protocol, and   Dialysis patients 2. Is GFR >/= 30 ml/min? Yes.    Patient's GFR today is 38 3. Is SCr </= 2? Yes.   Patient's SCr is 1.5 mg/dL 4. Did SCr increase >/= 0.5 in 24 hours? No. 5.Pt's weight >40kg  Yes.   6. Abnormal electrolyte(s): k 3.3  7. Electrolytes replaced per protocol 8.  Call MD STAT for K+ </= 2.5, Phos </= 1, or Mag </= 1 Physician:    Markus Daft A 12/10/2020 4:43 AM

## 2020-12-11 DIAGNOSIS — R6521 Severe sepsis with septic shock: Secondary | ICD-10-CM | POA: Diagnosis not present

## 2020-12-11 DIAGNOSIS — A419 Sepsis, unspecified organism: Secondary | ICD-10-CM | POA: Diagnosis not present

## 2020-12-11 DIAGNOSIS — N12 Tubulo-interstitial nephritis, not specified as acute or chronic: Secondary | ICD-10-CM | POA: Diagnosis not present

## 2020-12-11 DIAGNOSIS — N179 Acute kidney failure, unspecified: Secondary | ICD-10-CM | POA: Diagnosis not present

## 2020-12-11 LAB — CBC WITH DIFFERENTIAL/PLATELET
Abs Immature Granulocytes: 0 10*3/uL (ref 0.00–0.07)
Abs Immature Granulocytes: 1.17 10*3/uL — ABNORMAL HIGH (ref 0.00–0.07)
Basophils Absolute: 0 10*3/uL (ref 0.0–0.1)
Basophils Absolute: 0.1 10*3/uL (ref 0.0–0.1)
Basophils Relative: 0 %
Basophils Relative: 0 %
Eosinophils Absolute: 0 10*3/uL (ref 0.0–0.5)
Eosinophils Absolute: 0.2 10*3/uL (ref 0.0–0.5)
Eosinophils Relative: 0 %
Eosinophils Relative: 1 %
HCT: 28.5 % — ABNORMAL LOW (ref 36.0–46.0)
HCT: 27.7 % — ABNORMAL LOW (ref 36.0–46.0)
Hemoglobin: 9.8 g/dL — ABNORMAL LOW (ref 12.0–15.0)
Hemoglobin: 9.8 g/dL — ABNORMAL LOW (ref 12.0–15.0)
Immature Granulocytes: 4 %
Lymphocytes Relative: 4 %
Lymphocytes Relative: 5 %
Lymphs Abs: 2.2 10*3/uL (ref 0.7–4.0)
Lymphs Abs: 1.4 10*3/uL (ref 0.7–4.0)
MCH: 28.2 pg (ref 26.0–34.0)
MCH: 27.9 pg (ref 26.0–34.0)
MCHC: 34.4 g/dL (ref 30.0–36.0)
MCHC: 35.4 g/dL (ref 30.0–36.0)
MCV: 81.9 fL (ref 80.0–100.0)
MCV: 78.9 fL — ABNORMAL LOW (ref 80.0–100.0)
Monocytes Absolute: 0.6 10*3/uL (ref 0.1–1.0)
Monocytes Absolute: 1.2 10*3/uL — ABNORMAL HIGH (ref 0.1–1.0)
Monocytes Relative: 1 %
Monocytes Relative: 4 %
Neutro Abs: 52.6 10*3/uL — ABNORMAL HIGH (ref 1.7–7.7)
Neutro Abs: 22.4 10*3/uL — ABNORMAL HIGH (ref 1.7–7.7)
Neutrophils Relative %: 95 %
Neutrophils Relative %: 86 %
Platelets: 429 10*3/uL — ABNORMAL HIGH (ref 150–400)
Platelets: 480 10*3/uL — ABNORMAL HIGH (ref 150–400)
RBC: 3.48 MIL/uL — ABNORMAL LOW (ref 3.87–5.11)
RBC: 3.51 MIL/uL — ABNORMAL LOW (ref 3.87–5.11)
RDW: 14 % (ref 11.5–15.5)
RDW: 14.3 % (ref 11.5–15.5)
WBC: 55.4 10*3/uL (ref 4.0–10.5)
WBC: 26.4 10*3/uL — ABNORMAL HIGH (ref 4.0–10.5)
nRBC: 0 % (ref 0.0–0.2)
nRBC: 0 /100 WBC
nRBC: 0.1 % (ref 0.0–0.2)

## 2020-12-11 LAB — BASIC METABOLIC PANEL
Anion gap: 10 (ref 5–15)
BUN: 33 mg/dL — ABNORMAL HIGH (ref 8–23)
CO2: 21 mmol/L — ABNORMAL LOW (ref 22–32)
Calcium: 8.4 mg/dL — ABNORMAL LOW (ref 8.9–10.3)
Chloride: 104 mmol/L (ref 98–111)
Creatinine, Ser: 0.9 mg/dL (ref 0.44–1.00)
GFR, Estimated: >60 mL/min (ref >60)
Glucose, Bld: 117 mg/dL — ABNORMAL HIGH (ref 70–99)
Potassium: 3.5 mmol/L (ref 3.5–5.1)
Sodium: 135 mmol/L (ref 135–145)

## 2020-12-11 LAB — GLUCOSE, CAPILLARY
Glucose-Capillary: 105 mg/dL — ABNORMAL HIGH (ref 70–99)
Glucose-Capillary: 122 mg/dL — ABNORMAL HIGH (ref 70–99)
Glucose-Capillary: 135 mg/dL — ABNORMAL HIGH (ref 70–99)
Glucose-Capillary: 177 mg/dL — ABNORMAL HIGH (ref 70–99)

## 2020-12-11 LAB — MAGNESIUM: Magnesium: 1.7 mg/dL (ref 1.7–2.4)

## 2020-12-11 LAB — PHOSPHORUS: Phosphorus: 3 mg/dL (ref 2.5–4.6)

## 2020-12-11 MED ORDER — ORAL CARE MOUTH RINSE
15.0000 mL | Freq: Two times a day (BID) | OROMUCOSAL | Status: DC
  Administered 2020-12-11 – 2020-12-15 (×6): 15 mL via OROMUCOSAL

## 2020-12-11 MED ORDER — LEVETIRACETAM 100 MG/ML PO SOLN
1500.0000 mg | Freq: Two times a day (BID) | ORAL | Status: DC
  Administered 2020-12-11 – 2020-12-15 (×8): 1500 mg via ORAL
  Filled 2020-12-11 (×11): qty 15

## 2020-12-11 MED ORDER — MAGNESIUM SULFATE 2 GM/50ML IV SOLN
2.0000 g | Freq: Once | INTRAVENOUS | Status: AC
  Administered 2020-12-11: 2 g via INTRAVENOUS
  Filled 2020-12-11: qty 50

## 2020-12-11 MED ORDER — LEVETIRACETAM 500 MG PO TABS
1500.0000 mg | ORAL_TABLET | Freq: Two times a day (BID) | ORAL | Status: DC
  Administered 2020-12-11: 1500 mg via ORAL
  Filled 2020-12-11: qty 3

## 2020-12-11 MED ORDER — LACOSAMIDE 50 MG PO TABS
200.0000 mg | ORAL_TABLET | Freq: Two times a day (BID) | ORAL | Status: DC
  Administered 2020-12-11 – 2020-12-15 (×9): 200 mg via ORAL
  Filled 2020-12-11 (×9): qty 4

## 2020-12-11 MED ORDER — LACTATED RINGERS IV SOLN: INTRAVENOUS | Status: AC

## 2020-12-11 MED ORDER — ADULT MULTIVITAMIN W/MINERALS CH
1.0000 | ORAL_TABLET | Freq: Every day | ORAL | Status: DC
  Administered 2020-12-11 – 2020-12-15 (×5): 1 via ORAL
  Filled 2020-12-11 (×5): qty 1

## 2020-12-11 MED ORDER — PHENYTOIN 125 MG/5ML PO SUSP
100.0000 mg | Freq: Three times a day (TID) | ORAL | Status: DC
  Administered 2020-12-11 – 2020-12-15 (×12): 100 mg via ORAL
  Filled 2020-12-11 (×13): qty 4

## 2020-12-11 MED ORDER — CEFDINIR 250 MG/5ML PO SUSR
300.0000 mg | Freq: Two times a day (BID) | ORAL | Status: DC
  Administered 2020-12-11 – 2020-12-15 (×8): 300 mg via ORAL
  Filled 2020-12-11 (×11): qty 6

## 2020-12-11 MED ORDER — PHENYTOIN SODIUM EXTENDED 100 MG PO CAPS
100.0000 mg | ORAL_CAPSULE | Freq: Three times a day (TID) | ORAL | Status: DC
  Filled 2020-12-11: qty 1

## 2020-12-11 MED ORDER — ROSUVASTATIN CALCIUM 20 MG PO TABS
20.0000 mg | ORAL_TABLET | Freq: Every day | ORAL | Status: DC
  Administered 2020-12-11 – 2020-12-15 (×5): 20 mg via ORAL
  Filled 2020-12-11 (×5): qty 1

## 2020-12-11 MED ORDER — POTASSIUM CHLORIDE 10 MEQ/100ML IV SOLN
10.0000 meq | INTRAVENOUS | Status: AC
Start: 2020-12-11 — End: 2020-12-11
  Administered 2020-12-11 (×4): 10 meq via INTRAVENOUS
  Filled 2020-12-11 (×4): qty 100

## 2020-12-11 MED ORDER — SERTRALINE HCL 50 MG PO TABS
50.0000 mg | ORAL_TABLET | Freq: Every day | ORAL | Status: DC
  Administered 2020-12-11 – 2020-12-15 (×5): 50 mg via ORAL
  Filled 2020-12-11 (×5): qty 1

## 2020-12-11 MED ORDER — SODIUM CHLORIDE 0.9 % IV SOLN
2.0000 g | Freq: Two times a day (BID) | INTRAVENOUS | Status: DC
Start: 2020-12-11 — End: 2020-12-11
  Administered 2020-12-11: 2 g via INTRAVENOUS
  Filled 2020-12-11: qty 2

## 2020-12-11 MED ORDER — CLOPIDOGREL BISULFATE 75 MG PO TABS
75.0000 mg | ORAL_TABLET | Freq: Every day | ORAL | Status: DC
  Administered 2020-12-11 – 2020-12-15 (×5): 75 mg via ORAL
  Filled 2020-12-11 (×5): qty 1

## 2020-12-11 NOTE — Consult Note (Signed)
WOC Nurse Consult Note: Patient receiving care in [facility] [phone] Reason for Consult: Left heel and right hip Wound type: Right hip wound is small pink scar tissue from previous healed wound.  Left heel is an unstageable PI 75% black 25% pink with no signs of infection.  Pressure Injury POA: Yes Drainage (amount, consistency, odor) None Periwound: intact  Dressing procedure/placement/frequency: Continue foam dressings to the right hip and sacral area. Change every 3 days.  Paint the left heel with betadine twice a day and allow to air dry before replacing the heel foam dressing. Continue Prevalon boots.   Monitor the wound area(s) for worsening of condition such as: Signs/symptoms of infection, increase in size, development of or worsening of odor, development of pain, or increased pain at the affected locations.   Notify the medical team if any of these develop.  Pressure Injury Prevention Bundle May use any that apply to this patient. Support surfaces (air mattress) chair cushion [place] # [phone]) Heel offloading boots [place] # [phone]) Turning and Positioning  Measures to reduce shear (draw sheet, knees up) Skin protection Products (Foam dressing) Moisture management products (Critic-Aid Barrier Cream (Purple top) Sween moisturizing lotion (Pink top in clean supply) Nutrition Management Protection for Medical Devices Routine Skin Assessment   Thank you for the consult. WOC nurse will not follow at this time.   Please re-consult the WOC team if needed.  Renaldo Reel Katrinka Blazing, MSN, RN, CMSRN, Angus Seller, [facility] Wound Treatment Associate Pager [phone]

## 2020-12-11 NOTE — Progress Notes (Addendum)
NAME:  Casey Edwards, MRN:  [id number], DOB:  [birth year], LOS: 3 ADMISSION DATE:  12/08/2020, CONSULTATION DATE:  10/7 REFERRING MD:  Dr. Audley Hose, CHIEF COMPLAINT:  Urosepsis   History of Present Illness:  Patient is a 65 year old female with pertinent past medical history of CVA, dementia, seizures, HTN, HLD, encephalopathy, right-sided hemiparesis, MDD presents to [facility] ED on 10/7 with AMS and hypotension.  According to family, patient can typically speak and state name but today has been obtunded.  Patient has been living in assisted living since earlier this year.  She was recently admitted in June 2022 with UTI.   Upon arrival to ED on 10/7 Foley catheter was placed with dark cloudy urine.  Urinalysis indicative of UTI.  Patient remains altered and not responding to sternal rub.  Patient was on room air with sats 99%.  White blood cell 55, hemoglobin 9.8.  NA 124, BUN 55, creatinine 3.14.  Initial lactic acid 2.6 now 3.3.  CXR with no significant findings.  CT head negative for acute abnormality; atrophy and chronic small vessel ischemic changes of white matter.  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  Patient was started on cefepime, Vanco, metronidazole.  Patient given 2 L boluses of fluid and still remained hypotensive and required low-dose levo to be started to keep MAP greater than 65.  Since starting antibiotics patient's mental status is slowly starting to improve with patient opening eyes to command but still not speaking and following commands.  PCCM consulted for ICU admission and medical management.  Pertinent  Medical History   Past Medical History:  Diagnosis Date    Hypercholesteremia    Hypertension    Seizures (HCC)    Stroke [facility])    Vitamin D deficiency     Significant Hospital Events: Including procedures, antibiotic start and stop dates in addition to other pertinent events   10/7: admitted to [facility] ICU; on 2 mcg Levo; received 2L bolus  Interim History / Subjective:  Patient continues to be more alert awake and engaging.  She is off pressor support this morning.  She has been having poor oral intake which is not far from her baseline.  Objective   Blood pressure 95/66, pulse 85, temperature (!) 97.5 F (36.4 C), temperature source Oral, resp. rate 16, weight 59.6 kg, SpO2 100 %.        Intake/Output Summary (Last 24 hours) at 12/11/2020 0824 Last data filed at 12/11/2020 0700 Gross per 24 hour  Intake 2984.7 ml  Output 1745 ml  Net 1239.7 ml   Filed Weights   12/09/20 0102 12/10/20 0500 12/11/20 0500  Weight: 55 kg 58.5 kg 59.6 kg    Examination: General:  elderly and ill appearing HEENT: Moist membranes, PERRLA Neuro: Awake, more alert, not following commands or verbally communicating CV: s1s2, RRR, no m/r/g PULM:  dim clear BS bilaterally; RA sats 99% GI: soft, bsx4 active; tender to palpation of lower abdomen Extremities: warm/dry, no edema  Skin: no rashes or lesions, normal skin turgor  Resolved Hospital Problem list   Shock AKI Hyponatremia  Assessment & Plan:  Septic shock 2/2 UTI with possible pyelonephritis Admitted to ICU for vasopressors in the setting of urosepsis.  CT abdomen shows possible bilateral pyelonephritis  and a 10 mm bladder calculus.  Weaned off Levophed overnight.  MAPs maintaining above 65.  Systolics remain in the 80s.  Leukocytosis significantly improved at 26.  Continue antibiotics, can switch to orals.  Can likely transfer out of ICU today if her blood pressures and MAPs sustain.  -continue IV fluids as she is having poor oral intake -Trend fever/WBC curve -Continue antibiotics -Strict  I&O  Acute metabolic encephalopathy Hx of CVA Hx of seizures Mentation improving as patient more alert overnight and engaged.  At baseline she only speaks approximately 10 words. -Avoid sedating medicines -Continue home seizure meds: Phenytoin, Vimpat, Keppra -Can resume Plavix today  AKI, resolved Likely in the setting of urosepsis and decreased renal perfusion.  Cr 0.9. -Trend BMP  -Avoid nephrotoxic agents  Hyponatremia, resolved Sodium 135.  -Trend BMP  Acute on chronic anemia Hemoglobin stable at 9.8. -Trend CBC -Transfuse for hemoglobin less than 7  Hx of HTN and HLD -Hold home meds while hypotensive -Consider starting statin when able to take meds p.o.  Hx of MDD -Consider starting home Zoloft when able to take meds p.o.  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 thickening of distal esophagus with questionable esophagitis uterine fibroid 3.8 cm diameter -PPI for questionable esophagitis -No further work-up indicated for additional findings  Best Practice (right click and "Reselect all SmartList Selections" daily)  Diet/type: dysphagia diet (see orders) DVT prophylaxis: prophylactic heparin  GI prophylaxis: PPI Lines: N/A Foley:  Yes, and it is still needed Code Status:  DNR Last date of multidisciplinary goals of care discussion: 10/10  Labs   CBC: Recent Labs  Lab 12/08/20 1702 12/08/20 2236 12/09/20 0157 12/10/20 0258 12/11/20 0212  WBC 55.4* 54.3* 55.6* 43.3* 26.4*  NEUTROABS 52.6*  --  53.9* 38.9* 22.4*  HGB 9.8* 10.8* 9.8* 9.5* 9.8*  HCT 28.5* 31.0* 27.9* 26.6* 27.7*  MCV 81.9 82.2 81.6 78.7* 78.9*  PLT 429* 409* 452* 434* 480*    Basic Metabolic Panel: Recent Labs  Lab 12/08/20 1702 12/08/20 2236 12/09/20 0157 12/10/20 0258 12/11/20 0212  NA 124*  --  124* 131* 135  K 4.1  --  4.1 3.3* 3.5  CL 92*  --  93* 101 104  CO2 18*  --  20* 20* 21*  GLUCOSE 103*  --  155* 164*  117*  BUN 55*  --  49* 44* 33*  CREATININE 3.14* 2.53* 2.35* 1.50* 0.90  CALCIUM 8.1*  --  8.1* 8.1* 8.4*  MG  --   --  1.6*  --  1.7  PHOS  --   --   --   --  3.0   GFR: Estimated Creatinine Clearance: 50.9 mL/min (by C-G formula based on SCr of 0.9 mg/dL). Recent Labs  Lab 12/08/20 1902 12/08/20 2236 12/09/20 0014 12/09/20 0157 12/09/20 1143 12/10/20 0258 12/11/20 0212  WBC  --  54.3*  --  55.6*  --  43.3* 26.4*  LATICACIDVEN 3.3*  --  2.7* 2.4* 1.5  --   --     Liver Function Tests: Recent Labs  Lab 12/08/20 1702 12/09/20 0157  AST 34 35  ALT 23 23  ALKPHOS 102 112  BILITOT 0.9 0.7  PROT 6.1* 5.9*  ALBUMIN 1.6* 1.5*   No results for input(s): LIPASE, AMYLASE in the last 168 hours. No results for input(s): AMMONIA in the last 168 hours.  ABG    Component Value Date/Time   TCO2  24 08/24/2020 0841     Coagulation Profile: Recent Labs  Lab 12/08/20 1702  INR 1.4*    Cardiac Enzymes: No results for input(s): CKTOTAL, CKMB, CKMBINDEX, TROPONINI in the last 168 hours.  HbA1C: Hgb A1c MFr Bld  Date/Time Value Ref Range Status  08/25/2020 06:04 AM 5.0 4.8 - 5.6 % Final    Comment:    (NOTE) Pre diabetes:          5.7%-6.4%  Diabetes:              >6.4%  Glycemic control for   <7.0% adults with diabetes     CBG: Recent Labs  Lab 12/09/20 1123 12/09/20 1526 12/10/20 2027 12/10/20 2344 12/11/20 0405  GLUCAP 139* 142* 119* 115* 122*    Review of Systems:   Unable to obtain. Patient with AMS and not following commands.  Past Medical History:  She,  has a past medical history of Hypercholesteremia, Hypertension, Seizures (HCC), Stroke (HCC), and Vitamin D deficiency.   Surgical History:  History reviewed. No pertinent surgical history.   Social History:   reports that she has never smoked. She has never used smokeless tobacco. She reports that she does not drink alcohol and does not use drugs.   Family History:  Her family history is not  on file.   Allergies No Known Allergies   Home Medications  Prior to Admission medications   Medication Sig Start Date End Date Taking? Authorizing Provider  acetaminophen (TYLENOL) 500 MG tablet Take 1,000 mg by mouth daily.   Yes [provider]  amLODipine (NORVASC) 5 MG tablet Take 5 mg by mouth daily.   Yes [provider]  baclofen (LIORESAL) 10 MG tablet Take 0.5 tablets (5 mg total) by mouth 2 (two) times daily as needed for muscle spasms. 08/30/20  Yes Merrilyn Puma, MD  Cholecalciferol (VITAMIN D3) 50 MCG (2000 UT) TABS Take 2,000 Units by mouth daily.   Yes [provider]  clopidogrel (PLAVIX) 75 MG tablet Take 75 mg by mouth daily.   Yes [provider]  diclofenac Sodium (VOLTAREN) 1 % GEL Apply 4 g topically 3 (three) times daily. Apply topically to bilateral knees. 08/30/20  Yes Merrilyn Puma, MD  Ensure Plus (ENSURE PLUS) LIQD Take 237 mLs by mouth 2 (two) times daily. Strawberry flavor   Yes [provider]  ferrous sulfate 325 (65 FE) MG tablet Take 325 mg by mouth daily.   Yes [provider]  lacosamide (VIMPAT) 200 MG TABS tablet Take 1 tablet (200 mg total) by mouth 2 (two) times daily. 08/30/20  Yes Merrilyn Puma, MD  levETIRAcetam (KEPPRA) 750 MG tablet Take 2 tablets (1,500 mg total) by mouth 2 (two) times daily. 08/30/20  Yes Merrilyn Puma, MD  magnesium hydroxide (MILK OF MAGNESIA) 400 MG/5ML suspension Take 30 mLs by mouth daily as needed for mild constipation.   Yes [provider]  Multiple Vitamins-Minerals (MULTIVITAMIN WITH MINERALS) tablet Take 1 tablet by mouth daily.   Yes [provider]  Omega-3 Fatty Acids (FISH OIL) 1000 MG CAPS Take 2,000 mg by mouth 2 (two) times daily.   Yes [provider]  phenytoin (DILANTIN) 100 MG ER capsule Take 1 capsule (100 mg total) by mouth 3 (three) times daily. 08/30/20  Yes Merrilyn Puma, MD  rosuvastatin (CRESTOR) 20 MG tablet Take 1 tablet  (20 mg total) by mouth at bedtime. 08/30/20  Yes Merrilyn Puma, MD  sertraline (ZOLOFT) 100 MG tablet Take 50 mg  by mouth daily.   Yes [provider]  tamsulosin (FLOMAX) 0.4 MG CAPS capsule Take 0.4 mg by mouth daily.   Yes [provider]     Lerry Liner, DO PGY-3 IMTS

## 2020-12-12 DIAGNOSIS — R6521 Severe sepsis with septic shock: Secondary | ICD-10-CM | POA: Diagnosis not present

## 2020-12-12 DIAGNOSIS — A419 Sepsis, unspecified organism: Secondary | ICD-10-CM | POA: Diagnosis not present

## 2020-12-12 DIAGNOSIS — N12 Tubulo-interstitial nephritis, not specified as acute or chronic: Secondary | ICD-10-CM | POA: Diagnosis not present

## 2020-12-12 DIAGNOSIS — E871 Hypo-osmolality and hyponatremia: Secondary | ICD-10-CM | POA: Diagnosis not present

## 2020-12-12 LAB — CBC WITH DIFFERENTIAL/PLATELET
Abs Immature Granulocytes: 0.91 10*3/uL — ABNORMAL HIGH (ref 0.00–0.07)
Basophils Absolute: 0.1 10*3/uL (ref 0.0–0.1)
Basophils Relative: 1 %
Eosinophils Absolute: 0.1 10*3/uL (ref 0.0–0.5)
Eosinophils Relative: 1 %
HCT: 29.5 % — ABNORMAL LOW (ref 36.0–46.0)
Hemoglobin: 10.3 g/dL — ABNORMAL LOW (ref 12.0–15.0)
Immature Granulocytes: 6 %
Lymphocytes Relative: 9 %
Lymphs Abs: 1.5 10*3/uL (ref 0.7–4.0)
MCH: 27.8 pg (ref 26.0–34.0)
MCHC: 34.9 g/dL (ref 30.0–36.0)
MCV: 79.7 fL — ABNORMAL LOW (ref 80.0–100.0)
Monocytes Absolute: 1 10*3/uL (ref 0.1–1.0)
Monocytes Relative: 6 %
Neutro Abs: 12.2 10*3/uL — ABNORMAL HIGH (ref 1.7–7.7)
Neutrophils Relative %: 77 %
Platelets: 487 10*3/uL — ABNORMAL HIGH (ref 150–400)
RBC: 3.7 MIL/uL — ABNORMAL LOW (ref 3.87–5.11)
RDW: 14.2 % (ref 11.5–15.5)
WBC: 15.8 10*3/uL — ABNORMAL HIGH (ref 4.0–10.5)
nRBC: 0 % (ref 0.0–0.2)

## 2020-12-12 LAB — MAGNESIUM: Magnesium: 1.8 mg/dL (ref 1.7–2.4)

## 2020-12-12 LAB — BASIC METABOLIC PANEL
Anion gap: 10 (ref 5–15)
BUN: 26 mg/dL — ABNORMAL HIGH (ref 8–23)
CO2: 20 mmol/L — ABNORMAL LOW (ref 22–32)
Calcium: 8.8 mg/dL — ABNORMAL LOW (ref 8.9–10.3)
Chloride: 108 mmol/L (ref 98–111)
Creatinine, Ser: 0.84 mg/dL (ref 0.44–1.00)
GFR, Estimated: >60 mL/min (ref >60)
Glucose, Bld: 149 mg/dL — ABNORMAL HIGH (ref 70–99)
Potassium: 3.8 mmol/L (ref 3.5–5.1)
Sodium: 138 mmol/L (ref 135–145)

## 2020-12-12 MED ORDER — MAGNESIUM SULFATE 2 GM/50ML IV SOLN
2.0000 g | Freq: Once | INTRAVENOUS | Status: AC
  Administered 2020-12-12: 2 g via INTRAVENOUS
  Filled 2020-12-12: qty 50

## 2020-12-12 NOTE — Progress Notes (Signed)
Speech Language Pathology Treatment: Dysphagia  Patient Details Name: Casey Edwards MRN: [id number] DOB: [birth year] Today's Date: 12/12/2020 Time: 2595-6387 SLP Time Calculation (min) (ACUTE ONLY): 15 min  Assessment / Plan / Recommendation Clinical Impression  Pt seen for follow up after BSE completed 12/10/20. RN was present during this session. Pt was awake, and cooperative with some PO presentations. She refused may trials. She appears to tolerate puree textures (applesauce) and thin liquids via cup and straw without overt s/s aspiration or obvious oral issues. Will continue puree diet/thin liquids with crushed meds. ST signing off at this time. Please reconsult if needs arise.   HPI HPI: Patient is a 65 y.o. female with PMH: CVA, dementia, seizures, HTN, HLD, encephalopathy, right sided hemiparesis, MDD who presents to [facility] ED from SNF on 10/7 with AMS and hypotension. CT head negative for acute abnormality. CXR did not show any active disease however CT abdomen pelvis had findings of Subsegmental atelectasis at both lung bases. Additional questionable area of infiltrate in LEFT lower lobe.      SLP Plan  Discharge SLP treatment due to goals met      Recommendations for follow up therapy are one component of a multi-disciplinary discharge planning process, led by the attending physician.  Recommendations may be updated based on patient status, additional functional criteria and insurance authorization.    Recommendations  Diet recommendations: Dysphagia 1 (puree);Thin liquid Liquids provided via: Straw;Cup Medication Administration: Crushed with puree Supervision: Full supervision/cueing for compensatory strategies;Staff to assist with self feeding Compensations: Slow rate;Small sips/bites Postural Changes and/or Swallow Maneuvers: Seated upright 90 degrees;Upright 30-60 min after meal                Oral Care Recommendations: Oral care BID;Staff/trained caregiver to provide  oral care Follow up Recommendations: 24 hour supervision/assistance;Skilled Nursing facility SLP Visit Diagnosis: Dysphagia, unspecified (R13.10) Plan: Discharge SLP treatment due to (comment)       GO               Holten Spano B. Quentin Ore, [facility], [place] Speech Language Pathologist Office: [phone]  Shonna Chock  12/12/2020, 11:29 AM

## 2020-12-12 NOTE — Progress Notes (Signed)
[facility] ADULT ICU REPLACEMENT PROTOCOL   The patient does apply for the [facility] 2 Adult ICU Electrolyte Replacment Protocol based on the criteria listed below:   1.Exclusion criteria: TCTS patients, ECMO patients and Hypothermia Protocol, and   Dialysis patients 2. Is GFR >/= 30 ml/min? Yes.    Patient's GFR today is >60 3. Is SCr </= 2? Yes.   Patient's SCr is 0.84 mg/dL 4. Did SCr increase >/= 0.5 in 24 hours? No. 5.Pt's weight >40kg  Yes.   6. Abnormal electrolyte(s): mag 1.8  7. Electrolytes replaced per protocol 8.  Call MD STAT for K+ </= 2.5, Phos </= 1, or Mag </= 1 Physician:    Markus Daft A 12/12/2020 6:05 AM

## 2020-12-12 NOTE — Progress Notes (Signed)
[facility] PROGRESS NOTE    Progress Note  Casey Edwards  YKD:[id number] DOB: Jun 20, 1955 DOA: 12/08/2020 PCP: [facility]     Brief Narrative:   Casey Edwards is an 65 y.o. female past medical history of CVA, dementia seizure encephalopathy and right sided hemiparesis comes to the [facility] ED on 12/08/2020 for altered mental status and hypotension upon arrival to the ED Foley was placed dark-colored urine came out white blood cell count of 55,000 hemoglobin of 9 sodium of 129 lactic acid of 2.6, CT scan of the head showed no acute findings CT scan of the abdomen showed cortical thickening with perinephric edema bilaterally admitted by [facility] for septic shock secondary to pyelonephritis   Antibiotics: 12/08/2020 cefepime  Microbiology data: Blood culture: Has remained negative till date Urine culture: Was inconclusive  Procedures: None  Assessment/Plan:   Septic shock secondarily to Pyelonephritis: Started empirically on cefepime initially required Levophed now off pressors. Having poor oral intake was started on IV fluids. Has remained afebrile leukocytosis is improving. She is positive about 5 L,, appears euvolemic on physical exam. Tachycardia resolved blood pressure stable.  Acute metabolic encephalopathy superimposed on advanced dementia/history of seizures: Nonverbal, going back to history she speaks minimally. Avoid sedatives, resume phenytoin Vimpat and Keppra.  Acute kidney injury: Likely prerenal azotemia creatinine has returned to baseline. KVO IV fluids. Allow a diet.  Hypovolemic hyponatremia: Resolved with normal saline hydration.  Normocytic anemia: No signs of overt bleeding, globin appears to be at baseline this morning is 10.3.  Essential hypertension/hyperlipidemia: Continue to hold antihypertensive medication can start statins.  History of depression: Resume Zoloft.  Esophagitis: On CT seen thickening of the distal  esophagus started on a PPI. Follow-up with GI as an outpatient.  Left heel ulcer present on admission unstageable. RN Pressure Injury Documentation: Pressure Injury 12/10/20 Heel Left;Posterior Unstageable - Full thickness tissue loss in which the base of the injury is covered by slough (yellow, tan, gray, green or brown) and/or eschar (tan, brown or black) in the wound bed. black, eschar, round (Active)  12/10/20 2000  Location: Heel  Location Orientation: Left;Posterior  Staging: Unstageable - Full thickness tissue loss in which the base of the injury is covered by slough (yellow, tan, gray, green or brown) and/or eschar (tan, brown or black) in the wound bed.  Wound Description (Comments): black, eschar, round  Present on Admission: Yes    Estimated body mass index is 22.92 kg/m as calculated from the following:   Height as of 08/24/20: 5\' 3"  (1.6 m).   Weight as of this encounter: 58.7 kg. Severe protein caloric malnutrition  DVT prophylaxis: lovenox Family Communication:none Status is: Inpatient  Remains inpatient appropriate because:Hemodynamically unstable  Dispo: The patient is from: Home              Anticipated d/c is to: Home              Patient currently is not medically stable to d/c.   Difficult to place patient No      Code Status:     Code Status Orders  (From admission, onward)           Start     Ordered   12/08/20 2237  Do not attempt resuscitation (DNR)  Continuous       Question Answer Comment  In the event of cardiac or respiratory ARREST Do not call a "code blue"   In the event of cardiac or respiratory ARREST Do  not perform Intubation, CPR, defibrillation or ACLS   In the event of cardiac or respiratory ARREST Use medication by any route, position, wound care, and other measures to relive pain and suffering. May use oxygen, suction and manual treatment of airway obstruction as needed for comfort.      12/08/20 2239           Code  Status History     Date Active Date Inactive Code Status Order ID Comments User Context   08/24/2020 1219 08/31/2020 0137 Full Code [id number]  Theotis Barrio, MD ED         IV Access:   Peripheral IV   Procedures and diagnostic studies:   No results found.   Medical Consultants:   None.   Subjective:    Casey Edwards nonverbal this morning  Objective:    Vitals:   12/12/20 0345 12/12/20 0400 12/12/20 0500 12/12/20 0600  BP:  94/67 97/77 98/76   Pulse:  (!) 105 100 98  Resp:  20 (!) 22 19  Temp: (!) 97.5 F (36.4 C)     TempSrc: Oral     SpO2:  97% 98% 98%  Weight:   58.7 kg    SpO2: 98 %   Intake/Output Summary (Last 24 hours) at 12/12/2020 0657 Last data filed at 12/12/2020 0600 Gross per 24 hour  Intake 2182.28 ml  Output 2704 ml  Net -521.72 ml   Filed Weights   12/10/20 0500 12/11/20 0500 12/12/20 0500  Weight: 58.5 kg 59.6 kg 58.7 kg    Exam: General exam: In no acute distress, cachectic Respiratory system: Good air movement and clear to auscultation. Cardiovascular system: S1 & S2 heard, RRR. No JVDclicks.  Gastrointestinal system: Abdomen is nondistended, soft and nontender.  Extremities: No pedal edema. Skin: No rashes, lesions or ulcers Psychiatry: No judgement or insight into medical condition.  Data Reviewed:    Labs: Basic Metabolic Panel: Recent Labs  Lab 12/08/20 1702 12/08/20 2236 12/09/20 0157 12/10/20 0258 12/11/20 0212 12/12/20 0244  NA 124*  --  124* 131* 135 138  K 4.1  --  4.1 3.3* 3.5 3.8  CL 92*  --  93* 101 104 108  CO2 18*  --  20* 20* 21* 20*  GLUCOSE 103*  --  155* 164* 117* 149*  BUN 55*  --  49* 44* 33* 26*  CREATININE 3.14* 2.53* 2.35* 1.50* 0.90 0.84  CALCIUM 8.1*  --  8.1* 8.1* 8.4* 8.8*  MG  --   --  1.6*  --  1.7 1.8  PHOS  --   --   --   --  3.0  --    GFR Estimated Creatinine Clearance: 54.5 mL/min (by C-G formula based on SCr of 0.84 mg/dL). Liver Function Tests: Recent Labs  Lab  12/08/20 1702 12/09/20 0157  AST 34 35  ALT 23 23  ALKPHOS 102 112  BILITOT 0.9 0.7  PROT 6.1* 5.9*  ALBUMIN 1.6* 1.5*   No results for input(s): LIPASE, AMYLASE in the last 168 hours. No results for input(s): AMMONIA in the last 168 hours. Coagulation profile Recent Labs  Lab 12/08/20 1702  INR 1.4*   COVID-19 Labs  No results for input(s): DDIMER, FERRITIN, LDH, CRP in the last 72 hours.  Lab Results  Component Value Date   SARSCOV2NAA NEGATIVE 12/08/2020   SARSCOV2NAA NEGATIVE 08/28/2020   SARSCOV2NAA NEGATIVE 08/24/2020   SARSCOV2NAA NEGATIVE 04/20/2019    CBC: Recent Labs  Lab 12/08/20 1702 12/08/20  2236 12/09/20 0157 12/10/20 0258 12/11/20 0212 12/12/20 0244  WBC 55.4* 54.3* 55.6* 43.3* 26.4* 15.8*  NEUTROABS 52.6*  --  53.9* 38.9* 22.4* 12.2*  HGB 9.8* 10.8* 9.8* 9.5* 9.8* 10.3*  HCT 28.5* 31.0* 27.9* 26.6* 27.7* 29.5*  MCV 81.9 82.2 81.6 78.7* 78.9* 79.7*  PLT 429* 409* 452* 434* 480* 487*   Cardiac Enzymes: No results for input(s): CKTOTAL, CKMB, CKMBINDEX, TROPONINI in the last 168 hours. BNP (last 3 results) No results for input(s): PROBNP in the last 8760 hours. CBG: Recent Labs  Lab 12/10/20 2027 12/10/20 2344 12/11/20 0405 12/11/20 1534 12/11/20 2345  GLUCAP 119* 115* 122* 105* 177*   D-Dimer: No results for input(s): DDIMER in the last 72 hours. Hgb A1c: No results for input(s): HGBA1C in the last 72 hours. Lipid Profile: No results for input(s): CHOL, HDL, LDLCALC, TRIG, CHOLHDL, LDLDIRECT in the last 72 hours. Thyroid function studies: No results for input(s): TSH, T4TOTAL, T3FREE, THYROIDAB in the last 72 hours.  Invalid input(s): FREET3 Anemia work up: No results for input(s): VITAMINB12, FOLATE, FERRITIN, TIBC, IRON, RETICCTPCT in the last 72 hours. Sepsis Labs: Recent Labs  Lab 12/08/20 1902 12/08/20 2236 12/09/20 0014 12/09/20 0157 12/09/20 1143 12/10/20 0258 12/11/20 0212 12/12/20 0244  WBC  --    < >  --  55.6*   --  43.3* 26.4* 15.8*  LATICACIDVEN 3.3*  --  2.7* 2.4* 1.5  --   --   --    < > = values in this interval not displayed.   Microbiology Recent Results (from the past 240 hour(s))  Resp Panel by RT-PCR (Flu A&B, Covid) Nasopharyngeal Swab     Status: None   Collection Time: 12/08/20  5:02 PM   Specimen: Nasopharyngeal Swab; Nasopharyngeal(NP) swabs in vial transport medium  Result Value Ref Range Status   SARS Coronavirus 2 by RT PCR NEGATIVE NEGATIVE Final    Comment: (NOTE) SARS-CoV-2 target nucleic acids are NOT DETECTED.  The SARS-CoV-2 RNA is generally detectable in upper respiratory specimens during the acute phase of infection. The lowest concentration of SARS-CoV-2 viral copies this assay can detect is 138 copies/mL. A negative result does not preclude SARS-Cov-2 infection and should not be used as the sole basis for treatment or other patient management decisions. A negative result may occur with  improper specimen collection/handling, submission of specimen other than nasopharyngeal swab, presence of viral mutation(s) within the areas targeted by this assay, and inadequate number of viral copies(<138 copies/mL). A negative result must be combined with clinical observations, patient history, and epidemiological information. The expected result is Negative.  Fact Sheet for Patients:  BloggerCourse.com  Fact Sheet for Healthcare Providers:  SeriousBroker.it  This test is no t yet approved or cleared by the [place] FDA and  has been authorized for detection and/or diagnosis of SARS-CoV-2 by FDA under an Emergency Use Authorization (EUA). This EUA will remain  in effect (meaning this test can be used) for the duration of the COVID-19 declaration under Section 564(b)(1) of the Act, 21 U.S.C.section 360bbb-3(b)(1), unless the authorization is terminated  or revoked sooner.       Influenza A by PCR NEGATIVE NEGATIVE  Final   Influenza B by PCR NEGATIVE NEGATIVE Final    Comment: (NOTE) The Xpert Xpress SARS-CoV-2/FLU/RSV plus assay is intended as an aid in the diagnosis of influenza from Nasopharyngeal swab specimens and should not be used as a sole basis for treatment. Nasal washings and aspirates are unacceptable for Xpert  Xpress SARS-CoV-2/FLU/RSV testing.  Fact Sheet for Patients: BloggerCourse.com  Fact Sheet for Healthcare Providers: SeriousBroker.it  This test is not yet approved or cleared by the [place] FDA and has been authorized for detection and/or diagnosis of SARS-CoV-2 by FDA under an Emergency Use Authorization (EUA). This EUA will remain in effect (meaning this test can be used) for the duration of the COVID-19 declaration under Section 564(b)(1) of the Act, 21 U.S.C. section 360bbb-3(b)(1), unless the authorization is terminated or revoked.  Performed at [facility], [address], [place], [place] [address]   Urine Culture     Status: Abnormal   Collection Time: 12/08/20  5:02 PM   Specimen: Urine, Catheterized  Result Value Ref Range Status   Specimen Description URINE, CATHETERIZED  Final   Special Requests   Final    NONE Performed at [facility], [address], [place], [place] [address]    Culture MULTIPLE SPECIES PRESENT, SUGGEST RECOLLECTION (A)  Final   Report Status 12/09/2020 FINAL  Final  Blood Culture (routine x 2)     Status: None (Preliminary result)   Collection Time: 12/08/20  5:14 PM   Specimen: BLOOD  Result Value Ref Range Status   Specimen Description BLOOD BLOOD RIGHT HAND  Final   Special Requests   Final    BOTTLES DRAWN AEROBIC ONLY Blood Culture results may not be optimal due to an inadequate volume of blood received in culture bottles   Culture   Final    NO GROWTH 3 DAYS Performed at [facility], [address] [place], [place] [address]    Report Status  PENDING  Incomplete  Blood Culture (routine x 2)     Status: None (Preliminary result)   Collection Time: 12/08/20  5:16 PM   Specimen: BLOOD LEFT ARM  Result Value Ref Range Status   Specimen Description BLOOD LEFT ARM  Final   Special Requests   Final    BOTTLES DRAWN AEROBIC ONLY Blood Culture results may not be optimal due to an inadequate volume of blood received in culture bottles   Culture   Final    NO GROWTH 3 DAYS Performed at [facility], [address], [place], [place] [address]    Report Status PENDING  Incomplete  MRSA Next Gen by PCR, Nasal     Status: None   Collection Time: 12/09/20  1:12 AM   Specimen: Nasal Mucosa; Nasal Swab  Result Value Ref Range Status   MRSA by PCR Next Gen NOT DETECTED NOT DETECTED Final    Comment: (NOTE) The GeneXpert MRSA Assay (FDA approved for NASAL specimens only), is one component of a comprehensive MRSA colonization surveillance program. It is not intended to diagnose MRSA infection nor to guide or monitor treatment for MRSA infections. Test performance is not FDA approved in patients less than 9 years old. Performed at [facility], [address] [place], [place] [address]      Medications:    cefdinir  300 mg Oral BID   chlorhexidine  15 mL Mouth Rinse BID   Chlorhexidine Gluconate Cloth  6 each Topical Daily   clopidogrel  75 mg Oral Daily   heparin  5,000 Units Subcutaneous Q8H   lacosamide  200 mg Oral BID   levETIRAcetam  1,500 mg Oral BID   mouth rinse  15 mL Mouth Rinse BID   multivitamin with minerals  1 tablet Oral Daily   phenytoin  100 mg Oral TID   rosuvastatin  20 mg Oral Daily   sertraline  50 mg Oral Daily   Continuous Infusions:  sodium chloride     magnesium sulfate bolus IVPB        LOS: 4 days   Marinda Elk  [facility]  12/12/2020, 6:57 AM

## 2020-12-13 DIAGNOSIS — G9341 Metabolic encephalopathy: Secondary | ICD-10-CM

## 2020-12-13 DIAGNOSIS — G8191 Hemiplegia, unspecified affecting right dominant side: Secondary | ICD-10-CM

## 2020-12-13 DIAGNOSIS — R4701 Aphasia: Secondary | ICD-10-CM

## 2020-12-13 DIAGNOSIS — E876 Hypokalemia: Secondary | ICD-10-CM

## 2020-12-13 DIAGNOSIS — R7989 Other specified abnormal findings of blood chemistry: Secondary | ICD-10-CM

## 2020-12-13 DIAGNOSIS — Z8673 Personal history of transient ischemic attack (TIA), and cerebral infarction without residual deficits: Secondary | ICD-10-CM

## 2020-12-13 DIAGNOSIS — A419 Sepsis, unspecified organism: Secondary | ICD-10-CM | POA: Diagnosis not present

## 2020-12-13 DIAGNOSIS — G40909 Epilepsy, unspecified, not intractable, without status epilepticus: Secondary | ICD-10-CM

## 2020-12-13 DIAGNOSIS — N10 Acute tubulo-interstitial nephritis: Secondary | ICD-10-CM | POA: Diagnosis not present

## 2020-12-13 DIAGNOSIS — F0390 Unspecified dementia without behavioral disturbance: Secondary | ICD-10-CM

## 2020-12-13 LAB — BASIC METABOLIC PANEL
Anion gap: 7 (ref 5–15)
BUN: 17 mg/dL (ref 8–23)
CO2: 23 mmol/L (ref 22–32)
Calcium: 8.4 mg/dL — ABNORMAL LOW (ref 8.9–10.3)
Chloride: 107 mmol/L (ref 98–111)
Creatinine, Ser: 0.6 mg/dL (ref 0.44–1.00)
GFR, Estimated: >60 mL/min (ref >60)
Glucose, Bld: 118 mg/dL — ABNORMAL HIGH (ref 70–99)
Potassium: 3.2 mmol/L — ABNORMAL LOW (ref 3.5–5.1)
Sodium: 137 mmol/L (ref 135–145)

## 2020-12-13 LAB — CBC WITH DIFFERENTIAL/PLATELET
Abs Immature Granulocytes: 0 10*3/uL (ref 0.00–0.07)
Basophils Absolute: 0 10*3/uL (ref 0.0–0.1)
Basophils Relative: 0 %
Eosinophils Absolute: 0.1 10*3/uL (ref 0.0–0.5)
Eosinophils Relative: 1 %
HCT: 27.8 % — ABNORMAL LOW (ref 36.0–46.0)
Hemoglobin: 9.6 g/dL — ABNORMAL LOW (ref 12.0–15.0)
Lymphocytes Relative: 12 %
Lymphs Abs: 1.3 10*3/uL (ref 0.7–4.0)
MCH: 27.7 pg (ref 26.0–34.0)
MCHC: 34.5 g/dL (ref 30.0–36.0)
MCV: 80.1 fL (ref 80.0–100.0)
Monocytes Absolute: 1 10*3/uL (ref 0.1–1.0)
Monocytes Relative: 9 %
Neutro Abs: 8.3 10*3/uL — ABNORMAL HIGH (ref 1.7–7.7)
Neutrophils Relative %: 78 %
Platelets: 440 10*3/uL — ABNORMAL HIGH (ref 150–400)
RBC: 3.47 MIL/uL — ABNORMAL LOW (ref 3.87–5.11)
RDW: 14.4 % (ref 11.5–15.5)
WBC: 10.6 10*3/uL — ABNORMAL HIGH (ref 4.0–10.5)
nRBC: 0 % (ref 0.0–0.2)
nRBC: 0 /100 WBC

## 2020-12-13 LAB — CULTURE, BLOOD (ROUTINE X 2)
Culture: NO GROWTH
Culture: NO GROWTH

## 2020-12-13 LAB — PHOSPHORUS: Phosphorus: 2.4 mg/dL — ABNORMAL LOW (ref 2.5–4.6)

## 2020-12-13 LAB — MAGNESIUM: Magnesium: 1.6 mg/dL — ABNORMAL LOW (ref 1.7–2.4)

## 2020-12-13 MED ORDER — MAGNESIUM SULFATE 2 GM/50ML IV SOLN
2.0000 g | Freq: Once | INTRAVENOUS | Status: AC
  Administered 2020-12-13: 2 g via INTRAVENOUS
  Filled 2020-12-13: qty 50

## 2020-12-13 MED ORDER — POTASSIUM CHLORIDE CRYS ER 20 MEQ PO TBCR
40.0000 meq | EXTENDED_RELEASE_TABLET | ORAL | Status: AC
Start: 2020-12-13 — End: 2020-12-13
  Administered 2020-12-13 (×2): 40 meq via ORAL
  Filled 2020-12-13 (×2): qty 2

## 2020-12-13 NOTE — Progress Notes (Addendum)
PROGRESS NOTE  Casey Edwards QAE:[id number] DOB: April 20, 1955   PCP: [facility]  Patient is from: ALF  DOA: 12/08/2020 LOS: 5  Chief complaints:  No chief complaint on file.    Brief Narrative / Interim history: 65 year old F with PMH of CVA with residual right hemiparesis, dementia, seizure, nonverbal and debility presented with altered mental status and hypotension, and admitted to ICU with septic shock due to bilateral pyelonephritis requiring vasopressors.  Eventually weaned off vasopressors, and transferred to [facility] on 12/12/2020.   Subjective: Seen and examined earlier this morning.  No major events overnight or this morning.  Patient is nonverbal but follows some commands.  Does not seem to be in distress.  Smiling.   Objective: Vitals:   12/13/20 0456 12/13/20 0500 12/13/20 0841 12/13/20 1604  BP: 101/71  97/74 97/67  Pulse: 87  89 87  Resp: 17  18 18   Temp: 97.7 F (36.5 C)  (!) 97.4 F (36.3 C) 98.2 F (36.8 C)  TempSrc: Oral     SpO2: 100%  100% 100%  Weight:  58.9 kg      Intake/Output Summary (Last 24 hours) at 12/13/2020 1710 Last data filed at 12/13/2020 1642 Gross per 24 hour  Intake 235 ml  Output 1001 ml  Net -766 ml   Filed Weights   12/11/20 0500 12/12/20 0500 12/13/20 0500  Weight: 59.6 kg 58.7 kg 58.9 kg    Examination:  GENERAL: No apparent distress.  Nontoxic. HEENT: MMM.  Vision and hearing grossly intact.  NECK: Supple.  No apparent JVD.  RESP: On RA.  No IWOB.  Fair aeration bilaterally. CVS:  RRR. Heart sounds normal.  ABD/GI/GU: BS+. Abd soft, NTND.  MSK/EXT:  Moves extremities. No apparent deformity. No edema.  SKIN: no apparent skin lesion or wound NEURO: Follows command.  Nonverbal.  Not able to assess orientation.  Right hemiparesis with some contractures.  PSYCH: Calm.  Smiling.  Procedures:  None  Microbiology summarized: Blood cultures NGTD. Urine culture with multiple  species.  Assessment & Plan: Septic shock in the setting of bilateral pyelonephritis: POA -Culture data as above. -Completes antibiotic course with p.o. cefdinir on 10/16   Acute metabolic encephalopathy in patient with advanced dementia: Likely due to the above. -Follows commands. -Treat treatable causes -Reorientation and delirium precautions  History of seizure disorder -Continue phenytoin, Vimpat and Keppra.  Acute kidney injury/azotemia: Likely prerenal.  Resolved. Recent Labs    08/25/20 0604 08/26/20 0659 08/29/20 2021 08/30/20 0400 12/08/20 1702 12/08/20 2236 12/09/20 0157 12/10/20 0258 12/11/20 0212 12/12/20 0244 12/13/20 0204  BUN 8 6* 39* 31* 55*  --  49* 44* 33* 26* 17  CREATININE 0.54 0.54 1.69* 0.91 3.14* 2.53* 2.35* 1.50* 0.90 0.84 0.60  -Monitor intermittently  Dysphagia -On dysphagia 1 diet per SLP   Hypovolemic hyponatremia: Resolved. Resolved with normal saline hydration.   Normocytic anemia: Relatively stable. Recent Labs    08/26/20 0032 08/29/20 1302 08/30/20 0400 12/08/20 1702 12/08/20 2236 12/09/20 0157 12/10/20 0258 12/11/20 0212 12/12/20 0244 12/13/20 0204  HGB 12.0 12.7 11.5* 9.8* 10.8* 9.8* 9.5* 9.8* 10.3* 9.6*  -Monitor  Essential hypertension/hyperlipidemia: Normotensive -Continue holding home amlodipine.  History of CVA with right hemiparesis/nonverbal: stable. -Continue home meds  History of depression: Stable -Continue home Zoloft  Hypokalemia/hypomagnesemia -Replenish and recheck  Esophagitis: CT shows distal esophageal thickening concerning for esophagitis -Continue Protonix -May need outpatient follow-up with GI   Increased nutrition need Body mass index is 23 kg/m.  -  Liberate diet -Consult dietitian  Left heel ulcer present on admission unstageable. Pressure Injury 12/10/20 Heel Left;Posterior Unstageable - Full thickness tissue loss in which the base of the injury is covered by slough (yellow, tan,  gray, green or brown) and/or eschar (tan, brown or black) in the wound bed. black, eschar, round (Active)  12/10/20 2000  Location: Heel  Location Orientation: Left;Posterior  Staging: Unstageable - Full thickness tissue loss in which the base of the injury is covered by slough (yellow, tan, gray, green or brown) and/or eschar (tan, brown or black) in the wound bed.  Wound Description (Comments): black, eschar, round  Present on Admission: Yes (per progress note, yes. not previously documented in assessment)   DVT prophylaxis:  heparin injection 5,000 Units Start: 12/08/20 2245  Code Status: DNR/DNI Family Communication: Patient and/or RN. Available if any question.  Level of care: Med-Surg Status is: Inpatient  Remains inpatient appropriate because:Persistent severe electrolyte disturbances, IV treatments appropriate due to intensity of illness or inability to take PO, and Inpatient level of care appropriate due to severity of illness  Dispo: The patient is from: ALF              Anticipated d/c is to: ALF              Patient currently is not medically stable to d/c.   Difficult to place patient No       Consultants:  Pulmonology   Sch Meds:  Scheduled Meds:  cefdinir  300 mg Oral BID   chlorhexidine  15 mL Mouth Rinse BID   Chlorhexidine Gluconate Cloth  6 each Topical Daily   clopidogrel  75 mg Oral Daily   heparin  5,000 Units Subcutaneous Q8H   lacosamide  200 mg Oral BID   levETIRAcetam  1,500 mg Oral BID   mouth rinse  15 mL Mouth Rinse BID   multivitamin with minerals  1 tablet Oral Daily   phenytoin  100 mg Oral TID   rosuvastatin  20 mg Oral Daily   sertraline  50 mg Oral Daily   Continuous Infusions:  sodium chloride     PRN Meds:.docusate sodium, lip balm, polyethylene glycol  Antimicrobials: Anti-infectives (From admission, onward)    Start     Dose/Rate Route Frequency Ordered Stop   12/11/20 2200  cefdinir (OMNICEF) 250 MG/5ML suspension 300 mg         300 mg Oral 2 times daily 12/11/20 1110 Dec 30, 2020 2359   12/11/20 1000  ceFEPIme (MAXIPIME) 2 g in sodium chloride 0.9 % 100 mL IVPB  Status:  Discontinued        2 g 200 mL/hr over 30 Minutes Intravenous Every 12 hours 12/11/20 0911 12/11/20 1108   12/09/20 1800  ceFEPIme (MAXIPIME) 2 g in sodium chloride 0.9 % 100 mL IVPB  Status:  Discontinued        2 g 200 mL/hr over 30 Minutes Intravenous Every 24 hours 12/08/20 1931 12/11/20 0911   12/08/20 1930  vancomycin variable dose per unstable renal function (pharmacist dosing)  Status:  Discontinued         Does not apply See admin instructions 12/08/20 1931 12/09/20 1110   12/08/20 1715  ceFEPIme (MAXIPIME) 2 g in sodium chloride 0.9 % 100 mL IVPB        2 g 200 mL/hr over 30 Minutes Intravenous  Once 12/08/20 1702 12/08/20 1818   12/08/20 1715  metroNIDAZOLE (FLAGYL) IVPB 500 mg  500 mg 100 mL/hr over 60 Minutes Intravenous  Once 12/08/20 1702 12/08/20 1948   12/08/20 1715  vancomycin (VANCOREADY) IVPB 1250 mg/250 mL        1,250 mg 166.7 mL/hr over 90 Minutes Intravenous  Once 12/08/20 1702 12/08/20 2118        I have personally reviewed the following labs and images: CBC: Recent Labs  Lab 12/09/20 0157 12/10/20 0258 12/11/20 0212 12/12/20 0244 12/13/20 0204  WBC 55.6* 43.3* 26.4* 15.8* 10.6*  NEUTROABS 53.9* 38.9* 22.4* 12.2* 8.3*  HGB 9.8* 9.5* 9.8* 10.3* 9.6*  HCT 27.9* 26.6* 27.7* 29.5* 27.8*  MCV 81.6 78.7* 78.9* 79.7* 80.1  PLT 452* 434* 480* 487* 440*   BMP &GFR Recent Labs  Lab 12/09/20 0157 12/10/20 0258 12/11/20 0212 12/12/20 0244 12/13/20 0204  NA 124* 131* 135 138 137  K 4.1 3.3* 3.5 3.8 3.2*  CL 93* 101 104 108 107  CO2 20* 20* 21* 20* 23  GLUCOSE 155* 164* 117* 149* 118*  BUN 49* 44* 33* 26* 17  CREATININE 2.35* 1.50* 0.90 0.84 0.60  CALCIUM 8.1* 8.1* 8.4* 8.8* 8.4*  MG 1.6*  --  1.7 1.8 1.6*  PHOS  --   --  3.0  --  2.4*   Estimated Creatinine Clearance: 57.2 mL/min (by C-G formula  based on SCr of 0.6 mg/dL). Liver & Pancreas: Recent Labs  Lab 12/08/20 1702 12/09/20 0157  AST 34 35  ALT 23 23  ALKPHOS 102 112  BILITOT 0.9 0.7  PROT 6.1* 5.9*  ALBUMIN 1.6* 1.5*   No results for input(s): LIPASE, AMYLASE in the last 168 hours. No results for input(s): AMMONIA in the last 168 hours. Diabetic: No results for input(s): HGBA1C in the last 72 hours. Recent Labs  Lab 12/10/20 2027 12/10/20 2344 12/11/20 0405 12/11/20 1534 12/11/20 2345  GLUCAP 119* 115* 122* 105* 177*   Cardiac Enzymes: No results for input(s): CKTOTAL, CKMB, CKMBINDEX, TROPONINI in the last 168 hours. No results for input(s): PROBNP in the last 8760 hours. Coagulation Profile: Recent Labs  Lab 12/08/20 1702  INR 1.4*   Thyroid Function Tests: No results for input(s): TSH, T4TOTAL, FREET4, T3FREE, THYROIDAB in the last 72 hours. Lipid Profile: No results for input(s): CHOL, HDL, LDLCALC, TRIG, CHOLHDL, LDLDIRECT in the last 72 hours. Anemia Panel: No results for input(s): VITAMINB12, FOLATE, FERRITIN, TIBC, IRON, RETICCTPCT in the last 72 hours. Urine analysis:    Component Value Date/Time   COLORURINE STRAW (A) 12/08/2020 1702   APPEARANCEUR TURBID (A) 12/08/2020 1702   LABSPEC RESULTS UNAVAILABLE DUE TO INTERFERING SUBSTANCE 12/08/2020 1702   PHURINE RESULTS UNAVAILABLE DUE TO INTERFERING SUBSTANCE 12/08/2020 1702   GLUCOSEU RESULTS UNAVAILABLE DUE TO INTERFERING SUBSTANCE (A) 12/08/2020 1702   HGBUR RESULTS UNAVAILABLE DUE TO INTERFERING SUBSTANCE (A) 12/08/2020 1702   BILIRUBINUR RESULTS UNAVAILABLE DUE TO INTERFERING SUBSTANCE (A) 12/08/2020 1702   KETONESUR RESULTS UNAVAILABLE DUE TO INTERFERING SUBSTANCE (A) 12/08/2020 1702   PROTEINUR RESULTS UNAVAILABLE DUE TO INTERFERING SUBSTANCE (A) 12/08/2020 1702   NITRITE RESULTS UNAVAILABLE DUE TO INTERFERING SUBSTANCE (A) 12/08/2020 1702   LEUKOCYTESUR RESULTS UNAVAILABLE DUE TO INTERFERING SUBSTANCE (A) 12/08/2020 1702   Sepsis  Labs: Invalid input(s): PROCALCITONIN, LACTICIDVEN  Microbiology: Recent Results (from the past 240 hour(s))  Resp Panel by RT-PCR (Flu A&B, Covid) Nasopharyngeal Swab     Status: None   Collection Time: 12/08/20  5:02 PM   Specimen: Nasopharyngeal Swab; Nasopharyngeal(NP) swabs in vial transport medium  Result Value Ref Range Status  SARS Coronavirus 2 by RT PCR NEGATIVE NEGATIVE Final    Comment: (NOTE) SARS-CoV-2 target nucleic acids are NOT DETECTED.  The SARS-CoV-2 RNA is generally detectable in upper respiratory specimens during the acute phase of infection. The lowest concentration of SARS-CoV-2 viral copies this assay can detect is 138 copies/mL. A negative result does not preclude SARS-Cov-2 infection and should not be used as the sole basis for treatment or other patient management decisions. A negative result may occur with  improper specimen collection/handling, submission of specimen other than nasopharyngeal swab, presence of viral mutation(s) within the areas targeted by this assay, and inadequate number of viral copies(<138 copies/mL). A negative result must be combined with clinical observations, patient history, and epidemiological information. The expected result is Negative.  Fact Sheet for Patients:  BloggerCourse.com  Fact Sheet for Healthcare Providers:  SeriousBroker.it  This test is no t yet approved or cleared by the [place] FDA and  has been authorized for detection and/or diagnosis of SARS-CoV-2 by FDA under an Emergency Use Authorization (EUA). This EUA will remain  in effect (meaning this test can be used) for the duration of the COVID-19 declaration under Section 564(b)(1) of the Act, 21 U.S.C.section 360bbb-3(b)(1), unless the authorization is terminated  or revoked sooner.       Influenza A by PCR NEGATIVE NEGATIVE Final   Influenza B by PCR NEGATIVE NEGATIVE Final    Comment:  (NOTE) The Xpert Xpress SARS-CoV-2/FLU/RSV plus assay is intended as an aid in the diagnosis of influenza from Nasopharyngeal swab specimens and should not be used as a sole basis for treatment. Nasal washings and aspirates are unacceptable for Xpert Xpress SARS-CoV-2/FLU/RSV testing.  Fact Sheet for Patients: BloggerCourse.com  Fact Sheet for Healthcare Providers: SeriousBroker.it  This test is not yet approved or cleared by the [place] FDA and has been authorized for detection and/or diagnosis of SARS-CoV-2 by FDA under an Emergency Use Authorization (EUA). This EUA will remain in effect (meaning this test can be used) for the duration of the COVID-19 declaration under Section 564(b)(1) of the Act, 21 U.S.C. section 360bbb-3(b)(1), unless the authorization is terminated or revoked.  Performed at [facility], [address], [place], [place] [address]   Urine Culture     Status: Abnormal   Collection Time: 12/08/20  5:02 PM   Specimen: Urine, Catheterized  Result Value Ref Range Status   Specimen Description URINE, CATHETERIZED  Final   Special Requests   Final    NONE Performed at [facility], [address], [place], [place] [address]    Culture MULTIPLE SPECIES PRESENT, SUGGEST RECOLLECTION (A)  Final   Report Status 12/09/2020 FINAL  Final  Blood Culture (routine x 2)     Status: None   Collection Time: 12/08/20  5:14 PM   Specimen: BLOOD  Result Value Ref Range Status   Specimen Description BLOOD BLOOD RIGHT HAND  Final   Special Requests   Final    BOTTLES DRAWN AEROBIC ONLY Blood Culture results may not be optimal due to an inadequate volume of blood received in culture bottles   Culture   Final    NO GROWTH 5 DAYS Performed at [facility], [address], [place], [place] [address]    Report Status 12/13/2020 FINAL  Final  Blood Culture (routine x 2)     Status: None   Collection Time:  12/08/20  5:16 PM   Specimen: BLOOD LEFT ARM  Result Value  Ref Range Status   Specimen Description BLOOD LEFT ARM  Final   Special Requests   Final    BOTTLES DRAWN AEROBIC ONLY Blood Culture results may not be optimal due to an inadequate volume of blood received in culture bottles   Culture   Final    NO GROWTH 5 DAYS Performed at [facility], [address] [place], [place] [address]    Report Status 12/13/2020 FINAL  Final  MRSA Next Gen by PCR, Nasal     Status: None   Collection Time: 12/09/20  1:12 AM   Specimen: Nasal Mucosa; Nasal Swab  Result Value Ref Range Status   MRSA by PCR Next Gen NOT DETECTED NOT DETECTED Final    Comment: (NOTE) The GeneXpert MRSA Assay (FDA approved for NASAL specimens only), is one component of a comprehensive MRSA colonization surveillance program. It is not intended to diagnose MRSA infection nor to guide or monitor treatment for MRSA infections. Test performance is not FDA approved in patients less than 26 years old. Performed at [facility], [address] [place], [place] [address]     Radiology Studies: No results found.    Tilia Faso T. Larrie Lucia [facility]  If 7PM-7AM, please contact night-coverage [email or web address] 12/13/2020, 5:10 PM

## 2020-12-13 NOTE — Evaluation (Signed)
Physical Therapy Evaluation Patient Details Name: Casey Edwards MRN: [id number] DOB: 1955/08/04 Today's Date: 12/13/2020  History of Present Illness  Pt is a 65 y.o. F who presents to [facility] ED from SNF on 10/7 with AMS and hypotension. CT head negative for acute abnormality. CT scan of the abdomen showed cortical thickening with perinephric edema bilaterally admitted by [facility] for septic shock secondary to pyelonephritis. Significant PMH: CVA with residual right sided hemiparesis, dementia, seizure.  Clinical Impression  Pt admitted with above. Utilized music [employer]") for pt engagement and pt smiling and moving head side to side. Pt following 1 step commands inconsistently. PT performed PROM to all extremities, however, pt unable to tolerate any attempts at bilateral knee flexion due to pain. Pt rolling to R/L for peri care and pad change. When attempting to sit up on side of bed, pt clearly stating, "no," multiple times, so respected her wishes. At end of session, pt stating, "thank you." Pt likely at baseline in terms of her functional mobility. Recommend hoyer lift transfer for out of bed. No further acute PT needs; thank you for this consult.     Recommendations for follow up therapy are one component of a multi-disciplinary discharge planning process, led by the attending physician.  Recommendations may be updated based on patient status, additional functional criteria and insurance authorization.  Follow Up Recommendations No PT follow up;Other (comment) (return to ALF)    Equipment Recommendations  None recommended by PT    Recommendations for Other Services       Precautions / Restrictions Precautions Precautions: Fall;Other (comment) Precaution Comments: R hemiparesis (chronic), L knee contracture Restrictions Weight Bearing Restrictions: No      Mobility  Bed Mobility Overal bed mobility: Needs Assistance Bed Mobility: Rolling Rolling: Max assist         General bed  mobility comments: MaxA for rolling to R/L pt able to initiate movement by reaching    Transfers                 General transfer comment: deferred by pt clearly stating, "no" multiple times.  Ambulation/Gait                Stairs            Wheelchair Mobility    Modified Rankin (Stroke Patients Only)       Balance                                             Pertinent Vitals/Pain Pain Assessment: Faces Faces Pain Scale: Hurts whole lot Pain Location: grimacing with attempts at bilateral knee flexion Pain Descriptors / Indicators: Grimacing Pain Intervention(s): Monitored during session    Home Living Family/patient expects to be discharged to:: Skilled nursing facility                 Additional Comments: [facility] per chart    Prior Function Level of Independence: Needs assistance               Hand Dominance        Extremity/Trunk Assessment   Upper Extremity Assessment Upper Extremity Assessment: Defer to OT evaluation    Lower Extremity Assessment Lower Extremity Assessment: RLE deficits/detail;LLE deficits/detail RLE Deficits / Details: No active movement noted, hx of hemiparesis from CVA. LLE Deficits / Details: L knee extension contracture, ankle dorsiflexion ROM  to neutral       Communication   Communication: Expressive difficulties  Cognition Arousal/Alertness: Awake/alert Behavior During Therapy: WFL for tasks assessed/performed Overall Cognitive Status: History of cognitive impairments - at baseline                                 General Comments: Pt smiling and moving her head to [facility] music, follows 1 step commands inconsistently and also somewhat inconsistent with her head nods. Pt clearly stating, "no," to attempts to sit edge of bed and then "thank you," towards end of session.      General Comments  VSS    Exercises General Exercises - Lower Extremity Ankle  Circles/Pumps: PROM;Both;10 reps;Supine Heel Slides: PROM;Left;Supine;Other (comment) (2 reps, pt unable to tolerate due to pain) Hip ABduction/ADduction: PROM;Both;10 reps;Supine Other Exercises Other Exercises: supine: PROM BUE D1/D2  x10 each   Assessment/Plan    PT Assessment Patent does not need any further PT services  PT Problem List Decreased strength;Decreased range of motion;Decreased mobility;Decreased balance;Decreased cognition;Decreased safety awareness       PT Treatment Interventions      PT Goals (Current goals can be found in the Care Plan section)  Acute Rehab PT Goals Patient Stated Goal: unable PT Goal Formulation: All assessment and education complete, DC therapy    Frequency     Barriers to discharge        Co-evaluation               AM-PAC PT "6 Clicks" Mobility  Outcome Measure Help needed turning from your back to your side while in a flat bed without using bedrails?: A Lot Help needed moving from lying on your back to sitting on the side of a flat bed without using bedrails?: Total Help needed moving to and from a bed to a chair (including a wheelchair)?: Total Help needed standing up from a chair using your arms (e.g., wheelchair or bedside chair)?: Total Help needed to walk in hospital room?: Total Help needed climbing 3-5 steps with a railing? : Total 6 Click Score: 7    End of Session   Activity Tolerance: Patient limited by pain Patient left: in bed;with call bell/phone within reach;with bed alarm set Nurse Communication: Mobility status PT Visit Diagnosis: Hemiplegia and hemiparesis;Other abnormalities of gait and mobility (R26.89) Hemiplegia - Right/Left: Right Hemiplegia - caused by: Cerebral infarction    Time: 8891-6945 PT Time Calculation (min) (ACUTE ONLY): 27 min   Charges:   PT Evaluation $PT Eval Moderate Complexity: 1 Mod PT Treatments $Therapeutic Activity: 8-22 mins        Lillia Pauls, PT, DPT Acute  Rehabilitation Services Pager [phone] Office [phone]   Norval Morton 12/13/2020, 1:54 PM

## 2020-12-14 DIAGNOSIS — A419 Sepsis, unspecified organism: Secondary | ICD-10-CM | POA: Diagnosis not present

## 2020-12-14 DIAGNOSIS — L89620 Pressure ulcer of left heel, unstageable: Secondary | ICD-10-CM

## 2020-12-14 DIAGNOSIS — N12 Tubulo-interstitial nephritis, not specified as acute or chronic: Secondary | ICD-10-CM | POA: Diagnosis not present

## 2020-12-14 DIAGNOSIS — L89311 Pressure ulcer of right buttock, stage 1: Secondary | ICD-10-CM

## 2020-12-14 DIAGNOSIS — R1312 Dysphagia, oropharyngeal phase: Secondary | ICD-10-CM

## 2020-12-14 LAB — RENAL FUNCTION PANEL
Albumin: 1.6 g/dL — ABNORMAL LOW (ref 3.5–5.0)
Anion gap: 7 (ref 5–15)
BUN: 13 mg/dL (ref 8–23)
CO2: 24 mmol/L (ref 22–32)
Calcium: 8.5 mg/dL — ABNORMAL LOW (ref 8.9–10.3)
Chloride: 111 mmol/L (ref 98–111)
Creatinine, Ser: 0.6 mg/dL (ref 0.44–1.00)
GFR, Estimated: >60 mL/min (ref >60)
Glucose, Bld: 105 mg/dL — ABNORMAL HIGH (ref 70–99)
Phosphorus: 2.6 mg/dL (ref 2.5–4.6)
Potassium: 4 mmol/L (ref 3.5–5.1)
Sodium: 142 mmol/L (ref 135–145)

## 2020-12-14 LAB — CBC
HCT: 27.8 % — ABNORMAL LOW (ref 36.0–46.0)
Hemoglobin: 9.6 g/dL — ABNORMAL LOW (ref 12.0–15.0)
MCH: 27.6 pg (ref 26.0–34.0)
MCHC: 34.5 g/dL (ref 30.0–36.0)
MCV: 79.9 fL — ABNORMAL LOW (ref 80.0–100.0)
Platelets: 456 10*3/uL — ABNORMAL HIGH (ref 150–400)
RBC: 3.48 MIL/uL — ABNORMAL LOW (ref 3.87–5.11)
RDW: 14.7 % (ref 11.5–15.5)
WBC: 10.4 10*3/uL (ref 4.0–10.5)
nRBC: 0 % (ref 0.0–0.2)

## 2020-12-14 LAB — RESP PANEL BY RT-PCR (FLU A&B, COVID) ARPGX2
Influenza A by PCR: NEGATIVE
Influenza B by PCR: NEGATIVE
SARS Coronavirus 2 by RT PCR: NEGATIVE

## 2020-12-14 LAB — PHENYTOIN LEVEL, TOTAL: Phenytoin Lvl: 11.8 ug/mL (ref 10.0–20.0)

## 2020-12-14 LAB — MAGNESIUM: Magnesium: 1.6 mg/dL — ABNORMAL LOW (ref 1.7–2.4)

## 2020-12-14 MED ORDER — MIDODRINE HCL 5 MG PO TABS
5.0000 mg | ORAL_TABLET | Freq: Three times a day (TID) | ORAL | Status: DC
  Administered 2020-12-14 – 2020-12-15 (×3): 5 mg via ORAL
  Filled 2020-12-14 (×3): qty 1

## 2020-12-14 MED ORDER — PANTOPRAZOLE SODIUM 40 MG PO PACK
40.0000 mg | PACK | Freq: Every day | ORAL | Status: AC
Start: 2020-12-14 — End: ?

## 2020-12-14 MED ORDER — PANTOPRAZOLE SODIUM 40 MG PO TBEC: 40.0000 mg | DELAYED_RELEASE_TABLET | Freq: Every day | ORAL | 1 refills | Status: DC

## 2020-12-14 MED ORDER — SODIUM CHLORIDE 0.9 % IV BOLUS
500.0000 mL | Freq: Once | INTRAVENOUS | Status: AC
  Administered 2020-12-14: 500 mL via INTRAVENOUS

## 2020-12-14 MED ORDER — PHENYTOIN 125 MG/5ML PO SUSP: 100.0000 mg | Freq: Three times a day (TID) | ORAL | 12 refills | Status: DC

## 2020-12-14 MED ORDER — MAGNESIUM SULFATE 4 GM/100ML IV SOLN
4.0000 g | Freq: Once | INTRAVENOUS | Status: AC
  Administered 2020-12-14: 4 g via INTRAVENOUS
  Filled 2020-12-14: qty 100

## 2020-12-14 MED ORDER — MIDODRINE HCL 5 MG PO TABS: 5.0000 mg | ORAL_TABLET | Freq: Three times a day (TID) | ORAL | 0 refills | Status: AC

## 2020-12-14 MED ORDER — CEFDINIR 250 MG/5ML PO SUSR: 300.0000 mg | Freq: Two times a day (BID) | ORAL | 0 refills | Status: AC

## 2020-12-14 NOTE — Evaluation (Signed)
Occupational Therapy Evaluation Patient Details Name: Casey Edwards MRN: [id number] DOB: 1955/11/19 Today's Date: 12/14/2020   History of Present Illness Pt is a 65 y.o. F who presents to [facility] ED from SNF on 10/7 with AMS and hypotension. CT head negative for acute abnormality. CT scan of the abdomen showed cortical thickening with perinephric edema bilaterally admitted by [facility] for septic shock secondary to pyelonephritis. Significant PMH: CVA with residual right sided hemiparesis, dementia, seizure.   Clinical Impression   Pt admitted with the above diagnoses and presents with below problem list. Pt will benefit from continued acute OT to address the below listed deficits and maximize independence with basic ADLs prior to d/c to venue below. PTA pt needed assistance with ADLs. Pt currently needs max-total A with ADLs at bed level. Recommend staff utilize [employer] for OOB transfers. Edema noted in R hand, elevated on pillow. Recommend quarterly turns as pt is at increased risk for skin breakdown.        Recommendations for follow up therapy are one component of a multi-disciplinary discharge planning process, led by the attending physician.  Recommendations may be updated based on patient status, additional functional criteria and insurance authorization.   Follow Up Recommendations  SNF    Equipment Recommendations  Other (comment) (defer to next venue)    Recommendations for Other Services       Precautions / Restrictions Precautions Precautions: Fall;Other (comment) Precaution Comments: R hemiparesis (chronic), L knee contracture Restrictions Weight Bearing Restrictions: No      Mobility Bed Mobility Overal bed mobility: Needs Assistance Bed Mobility: Rolling Rolling: Max assist         General bed mobility comments: MaxA for rolling to R/L pt able to initiate movement by reaching    Transfers                 General transfer comment: unable to assess     Balance                                           ADL either performed or assessed with clinical judgement   ADL Overall ADL's : Needs assistance/impaired Eating/Feeding: Total assistance   Grooming: Total assistance   Upper Body Bathing: Total assistance   Lower Body Bathing: Total assistance   Upper Body Dressing : Total assistance   Lower Body Dressing: Total assistance                 General ADL Comments: Bed level sponge bath underway at start of session. Pt needed max-total A to roll to each side.     Vision         Perception     Praxis      Pertinent Vitals/Pain Pain Assessment: Faces Faces Pain Scale: Hurts even more Pain Location: grimace with rolling to right side Pain Descriptors / Indicators: Grimacing Pain Intervention(s): Monitored during session;Limited activity within patient's tolerance;Repositioned     Hand Dominance     Extremity/Trunk Assessment Upper Extremity Assessment Upper Extremity Assessment: RUE deficits/detail;Generalized weakness RUE Deficits / Details: R hemiplegia at baseline. edema noted in R hand, elevated on pillow at end of session. RUE Coordination: decreased fine motor;decreased gross motor   Lower Extremity Assessment Lower Extremity Assessment: Defer to PT evaluation       Communication Communication Communication: Expressive difficulties   Cognition Arousal/Alertness: Awake/alert Behavior During Therapy:  WFL for tasks assessed/performed Overall Cognitive Status: History of cognitive impairments - at baseline                                 General Comments: Limited verbalizations, one word responses. Expressive difficulties,   General Comments  brother arrived at end of session. Pt appears delighted to see him.    Exercises     Shoulder Instructions      Home Living Family/patient expects to be discharged to:: Skilled nursing facility                                  Additional Comments: [facility] per chart      Prior Functioning/Environment Level of Independence: Needs assistance                 OT Problem List: Decreased strength;Decreased range of motion;Decreased activity tolerance;Impaired balance (sitting and/or standing);Decreased coordination;Decreased cognition;Decreased safety awareness;Decreased knowledge of use of DME or AE;Decreased knowledge of precautions;Impaired sensation;Impaired tone;Impaired UE functional use;Pain;Increased edema      OT Treatment/Interventions: Self-care/ADL training;Therapeutic exercise;Neuromuscular education;Energy conservation;DME and/or AE instruction;Therapeutic activities;Cognitive remediation/compensation;Patient/family education;Balance training    OT Goals(Current goals can be found in the care plan section) Acute Rehab OT Goals Patient Stated Goal: unable OT Goal Formulation: Patient unable to participate in goal setting Time For Goal Achievement: 12/28/20 Potential to Achieve Goals: Fair ADL Goals Pt Will Perform Eating: with mod assist;bed level Pt Will Perform Grooming: with mod assist;bed level Pt Will Perform Upper Body Dressing: with mod assist;bed level Additional ADL Goal #1: Pt will complete roll to right side with mod A to faciliate bed level bathing/dressing/ positioning.  OT Frequency: Min 2X/week   Barriers to D/C:            Co-evaluation              AM-PAC OT "6 Clicks" Daily Activity     Outcome Measure Help from another person eating meals?: Total Help from another person taking care of personal grooming?: Total Help from another person toileting, which includes using toliet, bedpan, or urinal?: Total Help from another person bathing (including washing, rinsing, drying)?: Total Help from another person to put on and taking off regular upper body clothing?: Total Help from another person to put on and taking off regular lower body clothing?:  Total 6 Click Score: 6   End of Session Nurse Communication: Other (comment) (NT present during most of session)  Activity Tolerance: Patient limited by fatigue Patient left: in bed;with call bell/phone within reach;with bed alarm set;with family/visitor present  OT Visit Diagnosis: Other abnormalities of gait and mobility (R26.89);Muscle weakness (generalized) (M62.81);Other symptoms and signs involving cognitive function;Other symptoms and signs involving the nervous system (R29.898);Cognitive communication deficit (R41.841);Hemiplegia and hemiparesis;Pain Symptoms and signs involving cognitive functions: Cerebral infarction Hemiplegia - Right/Left: Right                Time: 3154-0086 OT Time Calculation (min): 13 min Charges:  OT General Charges $OT Visit: 1 Visit OT Evaluation $OT Eval Moderate Complexity: 1 Mod  Raynald Kemp, OT Acute Rehabilitation Services Pager: [phone] Office: [phone]   Pilar Grammes 12/14/2020, 10:18 AM

## 2020-12-14 NOTE — Plan of Care (Signed)
Consult discharge order today due to hypotension.  SBP in 70s and 80s.  Not tachycardic or symptomatic. No clinical signs of CHF.  Does not appear septic.  Start midodrine.  Check a.m. cortisol and TSH.

## 2020-12-14 NOTE — Progress Notes (Signed)
Phenytoin Follow-Up Consult Indication: Hx Seizures  No Known Allergies  Patient Measurements: Weight: 60.2 kg (132 lb 11.5 oz)   Body mass index is 23.51 kg/m.   Vital signs: Temp: 98 F (36.7 C) (10/13 0456) Temp Source: Oral (10/13 0456) BP: 106/80 (10/13 0456) Pulse Rate: 108 (10/13 0456)  Labs: Lab Results  Component Value Date/Time   Albumin 1.6 (L) 12/14/2020 0300   Phenytoin Lvl 11.8 12/14/2020 0300   Lab Results  Component Value Date   PHENYTOIN 11.8 12/14/2020   Estimated Creatinine Clearance: 57.2 mL/min (by C-G formula based on SCr of 0.6 mg/dL).     Assessment: Patient was on 100mg  ER TID prior to admission. Corrected 10 hour level on 10/7 (on admission) was 11.25 (Goal 10-20). On 10/10 patient was switched to suspension given inability to swallow whole capsules. Suspension absorption may differ from ER capsules. Of note, suspension doses were given TID at 10am, 4pm, 10pm instead of q8 hr, which is preferred.   10/13 5 hour level - not a true trough Corrected phenytoin level (if needed): 28.1 Seizure activity: none Significant potential drug interactions: none  Goals of care:  Total phenytoin level: 10-20 mcg/ml Free phenytoin level: 1-2 mcg/ml  Plan:  Changed TID to Q8 hr administration times Repeat phenytoin level as a true trough on 10/14  If discharged on 10/13 on phenytoin suspension, consider decreasing to 90 mg Q8 hr to prevent adverse effects    11/13, PharmD, BCPS, BCCP Clinical Pharmacist  Please check AMION for all [facility] phone numbers After 10:00 PM, call Main Pharmacy [phone]

## 2020-12-14 NOTE — Discharge Summary (Addendum)
Physician Discharge Summary  Casey Edwards KZS:[id number] DOB: 1956-01-30 DOA: 12/08/2020  PCP: [facility]  Admit date: 12/08/2020 Discharge date: 12/14/2020 Admitted From: Long-term care facility Disposition: Long-term care facility Recommendations for Outpatient Follow-up:  Follow ups as below. Outpatient follow-up with neurology in 2 to 3 weeks Please obtain CBC/BMP/Mag at follow up Please follow up on the following pending results: None Home Health: SLP Equipment/Devices: None Discharge Condition: Stable CODE STATUS: DNR/DNI  Hospital Course: 65 year old F with PMH of CVA with residual right hemiparesis, dementia, seizure, dysphagia, nonverbal and debility presented with altered mental status and hypotension, and admitted to ICU with septic shock due to bilateral pyelonephritis requiring vasopressors.  Eventually weaned off vasopressors, and transferred to [facility] on 12/12/2020.   Patient remained stable off pressors.  Blood cultures negative.  Urine culture with multiple species.  Antibiotics de-escalated to p.o. cefdinir, and she continued to do well.  Discharged on p.o. cefdinir for 7 more days.   SLP recommended dysphagia 1 diet.  Recommend ongoing SLP evaluation and treatment  See individual problem list below for more on hospital course.  Discharge Diagnoses:  Septic shock in the setting of bilateral pyelonephritis: POA.  Sepsis physiology resolved.  Blood culture NGTD.  Urine culture with multiple species.  Antibiotics de-escalated to p.o. cefdinir.  Discharged on p.o. cefdinir for 7 more days (total of 12 days).    Acute metabolic encephalopathy in patient with advanced dementia: Likely due to the above.  Follows commands.  Seems to be at baseline. -Reorientation and delirium precautions   History of seizure disorder -Continue phenytoin, Vimpat and Keppra. -Outpatient follow-up with neurology in 2 to 3 weeks  Acute kidney  injury/azotemia: Likely prerenal.  Resolved. Recent Labs    08/26/20 0659 08/29/20 2021 08/30/20 0400 12/08/20 1702 12/08/20 2236 12/09/20 0157 12/10/20 0258 12/11/20 0212 12/12/20 0244 12/13/20 0204 12/14/20 0300  BUN 6* 39* 31* 55*  --  49* 44* 33* 26* 17 13  CREATININE 0.54 1.69* 0.91 3.14* 2.53* 2.35* 1.50* 0.90 0.84 0.60 0.60     Dysphagia -On dysphagia 1 diet per SLP -Recommend ongoing SLP eval and treatment   Hypovolemic hyponatremia: Resolved.   Normocytic anemia: Relatively stable. Recent Labs    08/29/20 1302 08/30/20 0400 12/08/20 1702 12/08/20 2236 12/09/20 0157 12/10/20 0258 12/11/20 0212 12/12/20 0244 12/13/20 0204 12/14/20 0300  HGB 12.7 11.5* 9.8* 10.8* 9.8* 9.5* 9.8* 10.3* 9.6* 9.6*    Essential hypertension/hyperlipidemia: Soft systolic blood pressure -Discontinued home amlodipine.   History of CVA with right hemiparesis/nonverbal: stable. -Continue home meds  History of depression: Stable -Continue home Zoloft   Hypokalemia/hypomagnesemia-hypokalemia resolved.  Hypomagnesemia replenished prior to discharge  Esophagitis: CT shows distal esophageal thickening concerning for esophagitis -Continue Protonix 40 mg daily -Outpatient follow-up with GI   Increased nutrition need Body mass index is 23.51 kg/m.  -Liberate diet      Left heel ulcer present on admission unstageable: POA Pressure Injury 12/10/20 Heel Left;Posterior Unstageable - Full thickness tissue loss in which the base of the injury is covered by slough (yellow, tan, gray, green or brown) and/or eschar (tan, brown or black) in the wound bed. black, eschar, round (Active)  12/10/20 2000  Location: Heel  Location Orientation: Left;Posterior  Staging: Unstageable - Full thickness tissue loss in which the base of the injury is covered by slough (yellow, tan, gray, green or brown) and/or eschar (tan, brown or black) in the wound bed.  Wound Description (Comments): black, eschar,  round  Present on Admission: Yes (per progress note, yes. not previously documented in assessment)    Discharge Exam: Vitals:   12/14/20 0456 12/14/20 0828 12/14/20 1032  BP: 106/80 (!) 86/65 (!) 83/66  Pulse: (!) 108 (!) 104 98  Temp: 98 F (36.7 C) 97.8 F (36.6 C) 98.4 F (36.9 C)  Resp: Weight:     SpO2: 94% 92% 94%  TempSrc: Oral       GENERAL: No apparent distress.  Nontoxic. HEENT: MMM.  Vision and hearing grossly intact.  NECK: Supple.  No apparent JVD.  RESP: 94% on RA.  No IWOB.  Fair aeration bilaterally. CVS:  RRR. Heart sounds normal.  ABD/GI/GU: Bowel sounds present. Soft. Non tender.  MSK/EXT:  Moves extremities. No apparent deformity. No edema.  SKIN: Healing pressure skin injury on right lower buttock and left heel NEURO: Awake and alert.  Nonverbal.  Not able to assess orientation.  Follows some commands.  Right hemiparesis. PSYCH: Calm. Normal affect.   Discharge Instructions  Discharge Instructions     Diet general   Complete by: As directed    Diet recommendations: Dysphagia 1 (puree);Thin liquid Liquids provided via: Straw;Cup Medication Administration: Crushed with puree Supervision: Full supervision/cueing for compensatory strategies;Staff to assist with self feeding Compensations: Slow rate;Small sips/bites Postural Changes and/or Swallow Maneuvers: Seated upright 90 degrees;Upright 30-60 min after meal   Discharge wound care:   Complete by: As directed    Paint the left heel with betadine twice a day and allow to air dry before replacing the heel foam dressing. Continue Prevalon boots.  Continue foam dressings to the right hip and sacral area. Change every 3 days.   Increase activity slowly   Complete by: As directed       Allergies as of 12/14/2020   No Known Allergies      Medication List     STOP taking these medications    amLODipine 5 MG tablet Commonly known as: NORVASC   phenytoin 100 MG ER capsule Commonly  known as: DILANTIN       TAKE these medications    acetaminophen 500 MG tablet Commonly known as: TYLENOL Take 1,000 mg by mouth daily.   baclofen 10 MG tablet Commonly known as: LIORESAL Take 0.5 tablets (5 mg total) by mouth 2 (two) times daily as needed for muscle spasms.   cefdinir 250 MG/5ML suspension Commonly known as: OMNICEF Take 6 mLs (300 mg total) by mouth 2 (two) times daily for 7 days.   clopidogrel 75 MG tablet Commonly known as: PLAVIX Take 75 mg by mouth daily.   diclofenac Sodium 1 % Gel Commonly known as: VOLTAREN Apply 4 g topically 3 (three) times daily. Apply topically to bilateral knees.   Ensure Plus Liqd Take 237 mLs by mouth 2 (two) times daily. Strawberry flavor   ferrous sulfate 325 (65 FE) MG tablet Take 325 mg by mouth daily.   Fish Oil 1000 MG Caps Take 2,000 mg by mouth 2 (two) times daily.   lacosamide 200 MG Tabs tablet Commonly known as: VIMPAT Take 1 tablet (200 mg total) by mouth 2 (two) times daily.   levETIRAcetam 750 MG tablet Commonly known as: KEPPRA Take 2 tablets (1,500 mg total) by mouth 2 (two) times daily.   magnesium hydroxide 400 MG/5ML suspension Commonly known as: MILK OF MAGNESIA Take 30 mLs by mouth daily as needed for mild constipation.   multivitamin with minerals tablet Take 1 tablet by mouth daily.  pantoprazole sodium 40 mg Commonly known as: PROTONIX Take 40 mg by mouth daily.   phenytoin 125 MG/5ML suspension Commonly known as: DILANTIN Take 4 mLs (100 mg total) by mouth 3 (three) times daily.   rosuvastatin 20 MG tablet Commonly known as: CRESTOR Take 1 tablet (20 mg total) by mouth at bedtime.   sertraline 100 MG tablet Commonly known as: ZOLOFT Take 50 mg by mouth daily.   tamsulosin 0.4 MG Caps capsule Commonly known as: FLOMAX Take 0.4 mg by mouth daily.   Vitamin D3 50 MCG (2000 UT) Tabs Take 2,000 Units by mouth daily.               Discharge Care Instructions  (From  admission, onward)           Start     Ordered   12/14/20 0000  Discharge wound care:       Comments: Paint the left heel with betadine twice a day and allow to air dry before replacing the heel foam dressing. Continue Prevalon boots.  Continue foam dressings to the right hip and sacral area. Change every 3 days.   12/14/20 1037            Consultations: Pulmonology  Procedures/Studies: None   CT ABDOMEN PELVIS WO CONTRAST  Result Date: 12/08/2020 CLINICAL DATA:  Uncomplicated UTI, had a Foley catheter a few weeks ago with that was removed but had another Foley inserted due to urinary retention. Cloudy urine. History stroke. Altered mental status. History hypertension EXAM: CT ABDOMEN AND PELVIS WITHOUT CONTRAST TECHNIQUE: Multidetector CT imaging of the abdomen and pelvis was performed following the standard protocol without IV contrast. Sagittal and coronal MPR images reconstructed from axial data set. No oral contrast administered. Beam hardening artifacts in upper abdomen from patient's arms. COMPARISON:  None FINDINGS: Lower chest: Subsegmental atelectasis at both lung bases. Additional questionable area of infiltrate in LEFT lower lobe. Hepatobiliary: Dependent calculi within gallbladder. Liver unremarkable. Pancreas: Normal appearance Spleen: Normal appearance Adrenals/Urinary Tract: Thickening of BILATERAL adrenal glands without discrete mass. Both kidneys demonstrate cortical thickening and perinephric edema, without discrete mass. Urinary bladder decompressed by Foley catheter. Air within bladder. Calcification adjacent to the catheter balloon consistent with calculus, 8 x 10 x 7 mm. Stomach/Bowel: Probable normal appendix coiled adjacent to cecal tip. Increased stool in rectum. Thickening of distal esophagus. Stomach and remaining bowel loops unremarkable. No evidence of bowel obstruction or wall thickening. Vascular/Lymphatic: Atherosclerotic calcifications aorta, iliac  arteries, femoral arteries, coronary arteries. Aorta normal caliber. No adenopathy. Reproductive: Soft tissue mass exophytic from LEFT upper uterus suspected uterine leiomyoma 3.8 x 3.2 x 3.1 cm. Unremarkable LEFT ovary. Mass within RIGHT ovary 5.4 x 3.4 x 3.9 cm containing calcification and fat consistent with dermoid tumor. Other: Small amount of nonspecific free pelvic fluid. No free air. No hernia. Musculoskeletal: Superior endplate compression deformity of L5 vertebral body without surrounding infiltration to suggest acute. Osseous demineralization. 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 Cholelithiasis. 10 mm bladder calculus. Kidneys demonstrate cortical thickening with perinephric edema bilaterally, nonspecific but could represent pyelonephritis; recommend correlation with urinalysis. Bibasilar atelectasis with question small focus of infiltrate in LEFT lower lobe. Question uterine fibroid 3.8 cm diameter. Thickening of distal esophagus question esophagitis; recommend correlation with patient symptoms and consider non emergent follow-up esophagram or endoscopy assessment. Aortic Atherosclerosis (ICD10-I70.0). Electronically Signed   By: Ulyses Southward M.D.   On: 12/08/2020 19:38   CT HEAD WO CONTRAST ( )  Result Date: 12/08/2020 CLINICAL DATA:  Delirium EXAM: CT HEAD WITHOUT CONTRAST TECHNIQUE: Contiguous axial images were obtained from the base of the skull through the vertex without intravenous contrast. COMPARISON:  MRI 08/24/2020, CT brain 08/24/2020 FINDINGS: Brain: No acute territorial infarction, hemorrhage or intracranial mass. Extensive chronic small vessel ischemic changes of the white matter. Advanced atrophy. Chronic white matter infarct on the right. Encephalomalacia within the left temporal, encephalomalacia within the left basal ganglia and thalamus. Similar ex vacuo dilatation of left lateral ventricle. Parietal and occipital lobes. Vascular: No hyperdense vessels.   Carotid vascular calcification Skull: Normal. Negative for fracture or focal lesion. Sinuses/Orbits: No acute finding. Other: None IMPRESSION: 1. No definite CT evidence for acute intracranial abnormality. 2. Atrophy and chronic small vessel ischemic changes of the white matter. Extensive left-sided encephalomalacia as described above. Electronically Signed   By: Jasmine Pang M.D.   On: 12/08/2020 19:31   DG Chest Port 1 View  Result Date: 12/08/2020 CLINICAL DATA:  Altered EXAM: PORTABLE CHEST 1 VIEW COMPARISON:  04/20/2019 FINDINGS: The heart size and mediastinal contours are within normal limits. Aortic atherosclerosis. Both lungs are clear. The visualized skeletal structures are unremarkable. IMPRESSION: No active disease. Electronically Signed   By: Jasmine Pang M.D.   On: 12/08/2020 18:07       The results of significant diagnostics from this hospitalization (including imaging, microbiology, ancillary and laboratory) are listed below for reference.     Microbiology: Recent Results (from the past 240 hour(s))  Resp Panel by RT-PCR (Flu A&B, Covid) Nasopharyngeal Swab     Status: None   Collection Time: 12/08/20  5:02 PM   Specimen: Nasopharyngeal Swab; Nasopharyngeal(NP) swabs in vial transport medium  Result Value Ref Range Status   SARS Coronavirus 2 by RT PCR NEGATIVE NEGATIVE Final    Comment: (NOTE) SARS-CoV-2 target nucleic acids are NOT DETECTED.  The SARS-CoV-2 RNA is generally detectable in upper respiratory specimens during the acute phase of infection. The lowest concentration of SARS-CoV-2 viral copies this assay can detect is 138 copies/mL. A negative result does not preclude SARS-Cov-2 infection and should not be used as the sole basis for treatment or other patient management decisions. A negative result may occur with  improper specimen collection/handling, submission of specimen other than nasopharyngeal swab, presence of viral mutation(s) within the areas  targeted by this assay, and inadequate number of viral copies(<138 copies/mL). A negative result must be combined with clinical observations, patient history, and epidemiological information. The expected result is Negative.  Fact Sheet for Patients:  BloggerCourse.com  Fact Sheet for Healthcare Providers:  SeriousBroker.it  This test is no t yet approved or cleared by the [place] FDA and  has been authorized for detection and/or diagnosis of SARS-CoV-2 by FDA under an Emergency Use Authorization (EUA). This EUA will remain  in effect (meaning this test can be used) for the duration of the COVID-19 declaration under Section 564(b)(1) of the Act, 21 U.S.C.section 360bbb-3(b)(1), unless the authorization is terminated  or revoked sooner.       Influenza A by PCR NEGATIVE NEGATIVE Final   Influenza B by PCR NEGATIVE NEGATIVE Final    Comment: (NOTE) The Xpert Xpress SARS-CoV-2/FLU/RSV plus assay is intended as an aid in the diagnosis of influenza from Nasopharyngeal swab specimens and should not be used as a sole basis for treatment. Nasal washings and aspirates are unacceptable for Xpert Xpress SARS-CoV-2/FLU/RSV testing.  Fact Sheet for Patients: BloggerCourse.com  Fact Sheet for Healthcare Providers: SeriousBroker.it  This test is not yet approved or cleared by the [place] and has been authorized for detection and/or diagnosis of SARS-CoV-2 by FDA under an Emergency Use Authorization (EUA). This EUA will remain in effect (meaning this test can be used) for the duration of the COVID-19 declaration under Section 564(b)(1) of the Act, 21 U.S.C. section 360bbb-3(b)(1), unless the authorization is terminated or revoked.  Performed at [facility], [address], [place], [place] [address]   Urine Culture     Status: Abnormal   Collection Time: 12/08/20   5:02 PM   Specimen: Urine, Catheterized  Result Value Ref Range Status   Specimen Description URINE, CATHETERIZED  Final   Special Requests   Final    NONE Performed at [facility], [address], [place], [place] [address]    Culture MULTIPLE SPECIES PRESENT, SUGGEST RECOLLECTION (A)  Final   Report Status 12/09/2020 FINAL  Final  Blood Culture (routine x 2)     Status: None   Collection Time: 12/08/20  5:14 PM   Specimen: BLOOD  Result Value Ref Range Status   Specimen Description BLOOD BLOOD RIGHT HAND  Final   Special Requests   Final    BOTTLES DRAWN AEROBIC ONLY Blood Culture results may not be optimal due to an inadequate volume of blood received in culture bottles   Culture   Final    NO GROWTH 5 DAYS Performed at [facility], [address] [place], [place] [address]    Report Status 12/13/2020 FINAL  Final  Blood Culture (routine x 2)     Status: None   Collection Time: 12/08/20  5:16 PM   Specimen: BLOOD LEFT ARM  Result Value Ref Range Status   Specimen Description BLOOD LEFT ARM  Final   Special Requests   Final    BOTTLES DRAWN AEROBIC ONLY Blood Culture results may not be optimal due to an inadequate volume of blood received in culture bottles   Culture   Final    NO GROWTH 5 DAYS Performed at [facility], [address] [place], [place] [address]    Report Status 12/13/2020 FINAL  Final  MRSA Next Gen by PCR, Nasal     Status: None   Collection Time: 12/09/20  1:12 AM   Specimen: Nasal Mucosa; Nasal Swab  Result Value Ref Range Status   MRSA by PCR Next Gen NOT DETECTED NOT DETECTED Final    Comment: (NOTE) The GeneXpert MRSA Assay (FDA approved for NASAL specimens only), is one component of a comprehensive MRSA colonization surveillance program. It is not intended to diagnose MRSA infection nor to guide or monitor treatment for MRSA infections. Test performance is not FDA approved in patients less than 95 years old. Performed at  [facility], [address] [place], [place] [address]   Resp Panel by RT-PCR (Flu A&B, Covid) Nasopharyngeal Swab     Status: None   Collection Time: 12/14/20  9:54 AM   Specimen: Nasopharyngeal Swab; Nasopharyngeal(NP) swabs in vial transport medium  Result Value Ref Range Status   SARS Coronavirus 2 by RT PCR NEGATIVE NEGATIVE Final    Comment: (NOTE) SARS-CoV-2 target nucleic acids are NOT DETECTED.  The SARS-CoV-2 RNA is generally detectable in upper respiratory specimens during the acute phase of infection. The lowest concentration of SARS-CoV-2 viral copies this assay can detect is 138 copies/mL. A negative result does not preclude SARS-Cov-2 infection and should not  be used as the sole basis for treatment or other patient management decisions. A negative result may occur with  improper specimen collection/handling, submission of specimen other than nasopharyngeal swab, presence of viral mutation(s) within the areas targeted by this assay, and inadequate number of viral copies(<138 copies/mL). A negative result must be combined with clinical observations, patient history, and epidemiological information. The expected result is Negative.  Fact Sheet for Patients:  BloggerCourse.com  Fact Sheet for Healthcare Providers:  SeriousBroker.it  This test is no t yet approved or cleared by the [place] FDA and  has been authorized for detection and/or diagnosis of SARS-CoV-2 by FDA under an Emergency Use Authorization (EUA). This EUA will remain  in effect (meaning this test can be used) for the duration of the COVID-19 declaration under Section 564(b)(1) of the Act, 21 U.S.C.section 360bbb-3(b)(1), unless the authorization is terminated  or revoked sooner.       Influenza A by PCR NEGATIVE NEGATIVE Final   Influenza B by PCR NEGATIVE NEGATIVE Final    Comment: (NOTE) The Xpert Xpress SARS-CoV-2/FLU/RSV plus assay  is intended as an aid in the diagnosis of influenza from Nasopharyngeal swab specimens and should not be used as a sole basis for treatment. Nasal washings and aspirates are unacceptable for Xpert Xpress SARS-CoV-2/FLU/RSV testing.  Fact Sheet for Patients: BloggerCourse.com  Fact Sheet for Healthcare Providers: SeriousBroker.it  This test is not yet approved or cleared by the [place] FDA and has been authorized for detection and/or diagnosis of SARS-CoV-2 by FDA under an Emergency Use Authorization (EUA). This EUA will remain in effect (meaning this test can be used) for the duration of the COVID-19 declaration under Section 564(b)(1) of the Act, 21 U.S.C. section 360bbb-3(b)(1), unless the authorization is terminated or revoked.  Performed at [facility], [address], [place], [place] [address]      Labs:  CBC: Recent Labs  Lab 12/09/20 0157 12/10/20 0258 12/11/20 0212 12/12/20 0244 12/13/20 0204 12/14/20 0300  WBC 55.6* 43.3* 26.4* 15.8* 10.6* 10.4  NEUTROABS 53.9* 38.9* 22.4* 12.2* 8.3*  --   HGB 9.8* 9.5* 9.8* 10.3* 9.6* 9.6*  HCT 27.9* 26.6* 27.7* 29.5* 27.8* 27.8*  MCV 81.6 78.7* 78.9* 79.7* 80.1 79.9*  PLT 452* 434* 480* 487* 440* 456*   BMP &GFR Recent Labs  Lab 12/09/20 0157 12/10/20 0258 12/11/20 0212 12/12/20 0244 12/13/20 0204 12/14/20 0300  NA 124* 131* 135 138 137 142  K 4.1 3.3* 3.5 3.8 3.2* 4.0  CL 93* 101 104 108 107 111  CO2 20* 20* 21* 20* 23 24  GLUCOSE 155* 164* 117* 149* 118* 105*  BUN 49* 44* 33* 26* 17 13  CREATININE 2.35* 1.50* 0.90 0.84 0.60 0.60  CALCIUM 8.1* 8.1* 8.4* 8.8* 8.4* 8.5*  MG 1.6*  --  1.7 1.8 1.6* 1.6*  PHOS  --   --  3.0  --  2.4* 2.6   Estimated Creatinine Clearance: 57.2 mL/min (by C-G formula based on SCr of 0.6 mg/dL). Liver & Pancreas: Recent Labs  Lab 12/08/20 1702 12/09/20 0157 12/14/20 0300  AST 34 35  --   ALT 23 23  --   ALKPHOS  102 112  --   BILITOT 0.9 0.7  --   PROT 6.1* 5.9*  --   ALBUMIN 1.6* 1.5* 1.6*   No results for input(s): LIPASE, AMYLASE in the last 168 hours. No results for input(s): AMMONIA in the last 168 hours. Diabetic: No results for input(s): HGBA1C  in the last 72 hours. Recent Labs  Lab 12/10/20 2027 12/10/20 2344 12/11/20 0405 12/11/20 1534 12/11/20 2345  GLUCAP 119* 115* 122* 105* 177*   Cardiac Enzymes: No results for input(s): CKTOTAL, CKMB, CKMBINDEX, TROPONINI in the last 168 hours. No results for input(s): PROBNP in the last 8760 hours. Coagulation Profile: Recent Labs  Lab 12/08/20 1702  INR 1.4*   Thyroid Function Tests: No results for input(s): TSH, T4TOTAL, FREET4, T3FREE, THYROIDAB in the last 72 hours. Lipid Profile: No results for input(s): CHOL, HDL, LDLCALC, TRIG, CHOLHDL, LDLDIRECT in the last 72 hours. Anemia Panel: No results for input(s): VITAMINB12, FOLATE, FERRITIN, TIBC, IRON, RETICCTPCT in the last 72 hours. Urine analysis:    Component Value Date/Time   COLORURINE STRAW (A) 12/08/2020 1702   APPEARANCEUR TURBID (A) 12/08/2020 1702   LABSPEC RESULTS UNAVAILABLE DUE TO INTERFERING SUBSTANCE 12/08/2020 1702   PHURINE RESULTS UNAVAILABLE DUE TO INTERFERING SUBSTANCE 12/08/2020 1702   GLUCOSEU RESULTS UNAVAILABLE DUE TO INTERFERING SUBSTANCE (A) 12/08/2020 1702   HGBUR RESULTS UNAVAILABLE DUE TO INTERFERING SUBSTANCE (A) 12/08/2020 1702   BILIRUBINUR RESULTS UNAVAILABLE DUE TO INTERFERING SUBSTANCE (A) 12/08/2020 1702   KETONESUR RESULTS UNAVAILABLE DUE TO INTERFERING SUBSTANCE (A) 12/08/2020 1702   PROTEINUR RESULTS UNAVAILABLE DUE TO INTERFERING SUBSTANCE (A) 12/08/2020 1702   NITRITE RESULTS UNAVAILABLE DUE TO INTERFERING SUBSTANCE (A) 12/08/2020 1702   LEUKOCYTESUR RESULTS UNAVAILABLE DUE TO INTERFERING SUBSTANCE (A) 12/08/2020 1702   Sepsis Labs: Invalid input(s): PROCALCITONIN, LACTICIDVEN   Time coordinating discharge: 55  minutes  SIGNED:  Almon Hercules, MD  [facility] 12/14/2020, 1:12 PM

## 2020-12-14 NOTE — TOC Transition Note (Addendum)
Transition of Care [facility]) - CM/SW Discharge Note   Patient Details  Name: Casey Edwards MRN: [id number] Date of Birth: [birth year]  Transition of Care [facility]) CM/SW Contact:  Jimmy Picket, LCSW Phone Number: 12/14/2020, 12:32 PM   Clinical Narrative:     Pts DC has been cancelled for today. CSW will continue to follow for DC planning.  Patient will DC to: [facility] Anticipated DC date: 12/14/20 Family notified: Brother [name] Transport by: Hubert Azure     Per MD patient ready for DC to [facility]. RN, patient, patient's family, and facility notified of DC. Discharge Summary and FL2 sent to facility. DC packet on chart. Insurance [employer] has been received and pt is covid negative. Ambulance transport requested for 4:30pm.    RN to call report to [phone]  CSW will sign off for now as social work intervention is no longer needed. Please consult [place] again if new needs arise.   Final next level of care: Long Term Nursing Home Barriers to Discharge: No Barriers Identified   Patient Goals and CMS Choice Patient states their goals for this hospitalization and ongoing recovery are:: return to LTC      Discharge Placement              Patient chooses bed at: [facility] Patient to be transferred to facility by: Hubert Azure Name of family member notified: Brother, [name] Patient and family notified of of transfer: 12/14/20  Discharge Plan and Services                                     Social Determinants of Health (SDOH) Interventions     Readmission Risk Interventions No flowsheet data found.   Jimmy Picket, LCSW Clinical Social Worker

## 2020-12-15 DIAGNOSIS — A419 Sepsis, unspecified organism: Secondary | ICD-10-CM | POA: Diagnosis not present

## 2020-12-15 DIAGNOSIS — L89311 Pressure ulcer of right buttock, stage 1: Secondary | ICD-10-CM | POA: Diagnosis not present

## 2020-12-15 DIAGNOSIS — L89620 Pressure ulcer of left heel, unstageable: Secondary | ICD-10-CM | POA: Diagnosis not present

## 2020-12-15 DIAGNOSIS — N12 Tubulo-interstitial nephritis, not specified as acute or chronic: Secondary | ICD-10-CM | POA: Diagnosis not present

## 2020-12-15 LAB — PHENYTOIN LEVEL, TOTAL: Phenytoin Lvl: 13.3 ug/mL (ref 10.0–20.0)

## 2020-12-15 MED ORDER — SODIUM CHLORIDE 0.9 % IV BOLUS
250.0000 mL | Freq: Once | INTRAVENOUS | Status: AC
  Administered 2020-12-15: 250 mL via INTRAVENOUS

## 2020-12-15 MED ORDER — PHENYTOIN 125 MG/5ML PO SUSP
125.0000 mg | Freq: Two times a day (BID) | ORAL | Status: DC
  Filled 2020-12-15 (×2): qty 5

## 2020-12-15 MED ORDER — PHENYTOIN 125 MG/5ML PO SUSP: 125.0000 mg | Freq: Two times a day (BID) | ORAL | 12 refills | Status: AC

## 2020-12-15 NOTE — Discharge Summary (Addendum)
Physician Discharge Summary  Casey Edwards NTI:[id number] DOB: 10-Apr-1955 DOA: 12/08/2020  PCP: [facility]  Admit date: 12/08/2020 Discharge date: 12/15/2020 Admitted From: Long-term care facility Disposition: Long-term care facility Recommendations for Outpatient Follow-up:  Follow ups as below. Outpatient follow-up with neurology in 2 to 3 weeks Please obtain CBC/BMP/Mag at follow up Please follow up on the following pending results: None Home Health: SLP Equipment/Devices: None Discharge Condition: Stable CODE STATUS: DNR/DNI  Hospital Course: 65 year old F with PMH of CVA with residual right hemiparesis, dementia, seizure, dysphagia, nonverbal and debility presented with altered mental status and hypotension, and admitted to ICU with septic shock due to bilateral pyelonephritis requiring vasopressors.  Eventually weaned off vasopressors, and transferred [facility] on 12/12/2020.   Patient remained stable off pressors for a period to time but required low-dose midodrine for blood support.  She is at risk for dehydration due to severe dementia.  Blood cultures negative.  Urine culture with multiple species.  Antibiotics de-escalated to p.o. cefdinir, and she continued to do well.  Discharged on p.o. cefdinir for 7 more days.   SLP recommended dysphagia 1 diet.  Recommend ongoing SLP evaluation and treatment  See individual problem list below for more on hospital course.  Discharge Diagnoses:  Septic shock in the setting of bilateral pyelonephritis: POA.  Sepsis physiology resolved.  Blood culture NGTD.  Urine culture with multiple species.  Antibiotics de-escalated to p.o. cefdinir.  Discharged on p.o. cefdinir for 7 more days (total of 12 days).    Acute metabolic encephalopathy in patient with advanced dementia: Likely due to the above.  Follows commands.  Seems to be at baseline. -Reorientation and delirium precautions -Liberate diet and ensure  adequate hydration.  Hypotension: Initially in the setting of sepsis.  She is not septic anymore.  No signs of CHF.  A.m. cortisol within normal.  She is at risk for dehydration due to dementia.  She may also have some degree of autonomic dysregulation from CVA.  She is not tachycardic.  Hypotension resolved after starting low-dose midodrine. -Ensure adequate hydration -Continue midodrine 5 mg 3 times daily. -Discontinued home amlodipine   History of seizure disorder -Continue phenytoin, Vimpat and Keppra. -Outpatient follow-up with neurology in 2 to 3 weeks  Acute kidney injury/azotemia: Likely prerenal.  Resolved. Recent Labs    08/26/20 0659 08/29/20 2021 08/30/20 0400 12/08/20 1702 12/08/20 2236 12/09/20 0157 12/10/20 0258 12/11/20 0212 12/12/20 0244 12/13/20 0204 12/14/20 0300  BUN 6* 39* 31* 55*  --  49* 44* 33* 26* 17 13  CREATININE 0.54 1.69* 0.91 3.14* 2.53* 2.35* 1.50* 0.90 0.84 0.60 0.60   Dysphagia -On dysphagia 1 diet per SLP -Recommend ongoing SLP eval and treatment   Hypovolemic hyponatremia: Resolved.   Normocytic anemia: Relatively stable. Recent Labs    08/29/20 1302 08/30/20 0400 12/08/20 1702 12/08/20 2236 12/09/20 0157 12/10/20 0258 12/11/20 0212 12/12/20 0244 12/13/20 0204 12/14/20 0300  HGB 12.7 11.5* 9.8* 10.8* 9.8* 9.5* 9.8* 10.3* 9.6* 9.6*     History of CVA with right hemiparesis/nonverbal: stable. -Continue home meds  History of depression: Stable -Continue home Zoloft   Hypokalemia/hypomagnesemia-hypokalemia resolved.  Hypomagnesemia replenished prior to discharge  Esophagitis: CT shows distal esophageal thickening concerning for esophagitis -Continue Protonix 40 mg daily -Outpatient follow-up with GI      Left heel ulcer present on admission unstageable: POA Pressure Injury 12/10/20 Heel Left;Posterior Unstageable - Full thickness tissue loss in which the base of the injury is covered by  slough (yellow, tan, gray, green or  brown) and/or eschar (tan, brown or black) in the wound bed. black, eschar, round (Active)  12/10/20 2000  Location: Heel  Location Orientation: Left;Posterior  Staging: Unstageable - Full thickness tissue loss in which the base of the injury is covered by slough (yellow, tan, gray, green or brown) and/or eschar (tan, brown or black) in the wound bed.  Wound Description (Comments): black, eschar, round  Present on Admission: Yes (per progress note, yes. not previously documented in assessment)    Discharge Exam: Vitals:   12/15/20 0302 12/15/20 0428 12/15/20 0455 12/15/20 0753  BP: (!) 88/64 93/63  91/68  Pulse: 83   88  Resp: 17   16  Temp: (!) 97.4 F (36.3 C)   97.8 F (36.6 C)  TempSrc: Oral     SpO2: 98%   95%  Weight:   61.8 kg      GENERAL: No apparent distress.  Nontoxic. HEENT: MMM.  Vision and hearing grossly intact.  NECK: Supple.  No apparent JVD.  RESP: 98% on RA.  No IWOB.  Fair aeration bilaterally. CVS:  RRR. Heart sounds normal.  ABD/GI/GU: Bowel sounds present. Soft. Non tender.  MSK/EXT:  Moves extremities. No apparent deformity. No edema.  SKIN: Healing pressure skin injury on right lower buttock.  Unstageable left heel pressure injury.  No signs of infection. NEURO: Awake and alert.  Nonverbal.  Not able to assess orientation.  Follows some commands.  Right hemiparesis. PSYCH: Calm. Normal affect.   Discharge Instructions  Discharge Instructions     Diet general   Complete by: As directed    Diet recommendations: Dysphagia 1 (puree);Thin liquid Liquids provided via: Straw;Cup Medication Administration: Crushed with puree Supervision: Full supervision/cueing for compensatory strategies;Staff to assist with self feeding Compensations: Slow rate;Small sips/bites Postural Changes and/or Swallow Maneuvers: Seated upright 90 degrees;Upright 30-60 min after meal   Discharge wound care:   Complete by: As directed    Paint the left heel with betadine twice  a day and allow to air dry before replacing the heel foam dressing. Continue Prevalon boots.  Continue foam dressings to the right hip and sacral area. Change every 3 days.   Increase activity slowly   Complete by: As directed       Allergies as of 12/15/2020   No Known Allergies      Medication List     STOP taking these medications    amLODipine 5 MG tablet Commonly known as: NORVASC   phenytoin 100 MG ER capsule Commonly known as: DILANTIN       TAKE these medications    acetaminophen 500 MG tablet Commonly known as: TYLENOL Take 1,000 mg by mouth daily.   baclofen 10 MG tablet Commonly known as: LIORESAL Take 0.5 tablets (5 mg total) by mouth 2 (two) times daily as needed for muscle spasms.   cefdinir 250 MG/5ML suspension Commonly known as: OMNICEF Take 6 mLs (300 mg total) by mouth 2 (two) times daily for 7 days.   clopidogrel 75 MG tablet Commonly known as: PLAVIX Take 75 mg by mouth daily.   diclofenac Sodium 1 % Gel Commonly known as: VOLTAREN Apply 4 g topically 3 (three) times daily. Apply topically to bilateral knees.   Ensure Plus Liqd Take 237 mLs by mouth 2 (two) times daily. Strawberry flavor   ferrous sulfate 325 (65 FE) MG tablet Take 325 mg by mouth daily.   Fish Oil 1000 MG Caps Take 2,000 mg  by mouth 2 (two) times daily.   lacosamide 200 MG Tabs tablet Commonly known as: VIMPAT Take 1 tablet (200 mg total) by mouth 2 (two) times daily.   levETIRAcetam 750 MG tablet Commonly known as: KEPPRA Take 2 tablets (1,500 mg total) by mouth 2 (two) times daily.   magnesium hydroxide 400 MG/5ML suspension Commonly known as: MILK OF MAGNESIA Take 30 mLs by mouth daily as needed for mild constipation.   midodrine 5 MG tablet Commonly known as: PROAMATINE Take 1 tablet (5 mg total) by mouth 3 (three) times daily with meals.   multivitamin with minerals tablet Take 1 tablet by mouth daily.   pantoprazole sodium 40 mg Commonly known  as: PROTONIX Take 40 mg by mouth daily.   phenytoin 125 MG/5ML suspension Commonly known as: DILANTIN Take 5 mLs (125 mg total) by mouth 2 (two) times daily.   rosuvastatin 20 MG tablet Commonly known as: CRESTOR Take 1 tablet (20 mg total) by mouth at bedtime.   sertraline 100 MG tablet Commonly known as: ZOLOFT Take 50 mg by mouth daily.   tamsulosin 0.4 MG Caps capsule Commonly known as: FLOMAX Take 0.4 mg by mouth daily.   Vitamin D3 50 MCG (2000 UT) Tabs Take 2,000 Units by mouth daily.               Discharge Care Instructions  (From admission, onward)           Start     Ordered   12/14/20 0000  Discharge wound care:       Comments: Paint the left heel with betadine twice a day and allow to air dry before replacing the heel foam dressing. Continue Prevalon boots.  Continue foam dressings to the right hip and sacral area. Change every 3 days.   12/14/20 1037            Consultations: Pulmonology  Procedures/Studies: None   CT ABDOMEN PELVIS WO CONTRAST  Result Date: 12/08/2020 CLINICAL DATA:  Uncomplicated UTI, had a Foley catheter a few weeks ago with that was removed but had another Foley inserted due to urinary retention. Cloudy urine. History stroke. Altered mental status. History hypertension EXAM: CT ABDOMEN AND PELVIS WITHOUT CONTRAST TECHNIQUE: Multidetector CT imaging of the abdomen and pelvis was performed following the standard protocol without IV contrast. Sagittal and coronal MPR images reconstructed from axial data set. No oral contrast administered. Beam hardening artifacts in upper abdomen from patient's arms. COMPARISON:  None FINDINGS: Lower chest: Subsegmental atelectasis at both lung bases. Additional questionable area of infiltrate in LEFT lower lobe. Hepatobiliary: Dependent calculi within gallbladder. Liver unremarkable. Pancreas: Normal appearance Spleen: Normal appearance Adrenals/Urinary Tract: Thickening of BILATERAL adrenal  glands without discrete mass. Both kidneys demonstrate cortical thickening and perinephric edema, without discrete mass. Urinary bladder decompressed by Foley catheter. Air within bladder. Calcification adjacent to the catheter balloon consistent with calculus, 8 x 10 x 7 mm. Stomach/Bowel: Probable normal appendix coiled adjacent to cecal tip. Increased stool in rectum. Thickening of distal esophagus. Stomach and remaining bowel loops unremarkable. No evidence of bowel obstruction or wall thickening. Vascular/Lymphatic: Atherosclerotic calcifications aorta, iliac arteries, femoral arteries, coronary arteries. Aorta normal caliber. No adenopathy. Reproductive: Soft tissue mass exophytic from LEFT upper uterus suspected uterine leiomyoma 3.8 x 3.2 x 3.1 cm. Unremarkable LEFT ovary. Mass within RIGHT ovary 5.4 x 3.4 x 3.9 cm containing calcification and fat consistent with dermoid tumor. Other: Small amount of nonspecific free pelvic fluid. No free air. No hernia.  Musculoskeletal: Superior endplate compression deformity of L5 vertebral body without surrounding infiltration to suggest acute. Osseous demineralization. 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 Cholelithiasis. 10 mm bladder calculus. Kidneys demonstrate cortical thickening with perinephric edema bilaterally, nonspecific but could represent pyelonephritis; recommend correlation with urinalysis. Bibasilar atelectasis with question small focus of infiltrate in LEFT lower lobe. Question uterine fibroid 3.8 cm diameter. Thickening of distal esophagus question esophagitis; recommend correlation with patient symptoms and consider non emergent follow-up esophagram or endoscopy assessment. Aortic Atherosclerosis (ICD10-I70.0). Electronically Signed   By: Ulyses Southward M.D.   On: 12/08/2020 19:38   CT HEAD WO CONTRAST ( )  Result Date: 12/08/2020 CLINICAL DATA:  Delirium EXAM: CT HEAD WITHOUT CONTRAST TECHNIQUE: Contiguous axial images were  obtained from the base of the skull through the vertex without intravenous contrast. COMPARISON:  MRI 08/24/2020, CT brain 08/24/2020 FINDINGS: Brain: No acute territorial infarction, hemorrhage or intracranial mass. Extensive chronic small vessel ischemic changes of the white matter. Advanced atrophy. Chronic white matter infarct on the right. Encephalomalacia within the left temporal, encephalomalacia within the left basal ganglia and thalamus. Similar ex vacuo dilatation of left lateral ventricle. Parietal and occipital lobes. Vascular: No hyperdense vessels.  Carotid vascular calcification Skull: Normal. Negative for fracture or focal lesion. Sinuses/Orbits: No acute finding. Other: None IMPRESSION: 1. No definite CT evidence for acute intracranial abnormality. 2. Atrophy and chronic small vessel ischemic changes of the white matter. Extensive left-sided encephalomalacia as described above. Electronically Signed   By: Jasmine Pang M.D.   On: 12/08/2020 19:31   DG Chest Port 1 View  Result Date: 12/08/2020 CLINICAL DATA:  Altered EXAM: PORTABLE CHEST 1 VIEW COMPARISON:  04/20/2019 FINDINGS: The heart size and mediastinal contours are within normal limits. Aortic atherosclerosis. Both lungs are clear. The visualized skeletal structures are unremarkable. IMPRESSION: No active disease. Electronically Signed   By: Jasmine Pang M.D.   On: 12/08/2020 18:07       The results of significant diagnostics from this hospitalization (including imaging, microbiology, ancillary and laboratory) are listed below for reference.     Microbiology: Recent Results (from the past 240 hour(s))  Resp Panel by RT-PCR (Flu A&B, Covid) Nasopharyngeal Swab     Status: None   Collection Time: 12/08/20  5:02 PM   Specimen: Nasopharyngeal Swab; Nasopharyngeal(NP) swabs in vial transport medium  Result Value Ref Range Status   SARS Coronavirus 2 by RT PCR NEGATIVE NEGATIVE Final    Comment: (NOTE) SARS-CoV-2 target nucleic  acids are NOT DETECTED.  The SARS-CoV-2 RNA is generally detectable in upper respiratory specimens during the acute phase of infection. The lowest concentration of SARS-CoV-2 viral copies this assay can detect is 138 copies/mL. A negative result does not preclude SARS-Cov-2 infection and should not be used as the sole basis for treatment or other patient management decisions. A negative result may occur with  improper specimen collection/handling, submission of specimen other than nasopharyngeal swab, presence of viral mutation(s) within the areas targeted by this assay, and inadequate number of viral copies(<138 copies/mL). A negative result must be combined with clinical observations, patient history, and epidemiological information. The expected result is Negative.  Fact Sheet for Patients:  BloggerCourse.com  Fact Sheet for Healthcare Providers:  SeriousBroker.it  This test is no t yet approved or cleared by the [place] FDA and  has been authorized for detection and/or diagnosis of SARS-CoV-2 by FDA under an Emergency Use Authorization (EUA). This EUA will remain  in effect (  meaning this test can be used) for the duration of the COVID-19 declaration under Section 564(b)(1) of the Act, 21 U.S.C.section 360bbb-3(b)(1), unless the authorization is terminated  or revoked sooner.       Influenza A by PCR NEGATIVE NEGATIVE Final   Influenza B by PCR NEGATIVE NEGATIVE Final    Comment: (NOTE) The Xpert Xpress SARS-CoV-2/FLU/RSV plus assay is intended as an aid in the diagnosis of influenza from Nasopharyngeal swab specimens and should not be used as a sole basis for treatment. Nasal washings and aspirates are unacceptable for Xpert Xpress SARS-CoV-2/FLU/RSV testing.  Fact Sheet for Patients: BloggerCourse.com  Fact Sheet for Healthcare Providers: SeriousBroker.it  This  test is not yet approved or cleared by the [place] FDA and has been authorized for detection and/or diagnosis of SARS-CoV-2 by FDA under an Emergency Use Authorization (EUA). This EUA will remain in effect (meaning this test can be used) for the duration of the COVID-19 declaration under Section 564(b)(1) of the Act, 21 U.S.C. section 360bbb-3(b)(1), unless the authorization is terminated or revoked.  Performed at [facility], [address] [place], [place] [address]   Urine Culture     Status: Abnormal   Collection Time: 12/08/20  5:02 PM   Specimen: Urine, Catheterized  Result Value Ref Range Status   Specimen Description URINE, CATHETERIZED  Final   Special Requests   Final    NONE Performed at [facility], [address] [place], [place] [address]    Culture MULTIPLE SPECIES PRESENT, SUGGEST RECOLLECTION (A)  Final   Report Status 12/09/2020 FINAL  Final  Blood Culture (routine x 2)     Status: None   Collection Time: 12/08/20  5:14 PM   Specimen: BLOOD  Result Value Ref Range Status   Specimen Description BLOOD BLOOD RIGHT HAND  Final   Special Requests   Final    BOTTLES DRAWN AEROBIC ONLY Blood Culture results may not be optimal due to an inadequate volume of blood received in culture bottles   Culture   Final    NO GROWTH 5 DAYS Performed at [facility], [address] [place], [place] [address]    Report Status 12/13/2020 FINAL  Final  Blood Culture (routine x 2)     Status: None   Collection Time: 12/08/20  5:16 PM   Specimen: BLOOD LEFT ARM  Result Value Ref Range Status   Specimen Description BLOOD LEFT ARM  Final   Special Requests   Final    BOTTLES DRAWN AEROBIC ONLY Blood Culture results may not be optimal due to an inadequate volume of blood received in culture bottles   Culture   Final    NO GROWTH 5 DAYS Performed at [facility], [address] [place], [place] [address]    Report Status 12/13/2020 FINAL  Final   MRSA Next Gen by PCR, Nasal     Status: None   Collection Time: 12/09/20  1:12 AM   Specimen: Nasal Mucosa; Nasal Swab  Result Value Ref Range Status   MRSA by PCR Next Gen NOT DETECTED NOT DETECTED Final    Comment: (NOTE) The GeneXpert MRSA Assay (FDA approved for NASAL specimens only), is one component of a comprehensive MRSA colonization surveillance program. It is not intended to diagnose MRSA infection nor to guide or monitor treatment for MRSA infections. Test performance is not FDA approved in patients less than 42 years old. Performed at [facility],  [address] [place], [place] [address]   Resp Panel by RT-PCR (Flu A&B, Covid) Nasopharyngeal Swab     Status: None   Collection Time: 12/14/20  9:54 AM   Specimen: Nasopharyngeal Swab; Nasopharyngeal(NP) swabs in vial transport medium  Result Value Ref Range Status   SARS Coronavirus 2 by RT PCR NEGATIVE NEGATIVE Final    Comment: (NOTE) SARS-CoV-2 target nucleic acids are NOT DETECTED.  The SARS-CoV-2 RNA is generally detectable in upper respiratory specimens during the acute phase of infection. The lowest concentration of SARS-CoV-2 viral copies this assay can detect is 138 copies/mL. A negative result does not preclude SARS-Cov-2 infection and should not be used as the sole basis for treatment or other patient management decisions. A negative result may occur with  improper specimen collection/handling, submission of specimen other than nasopharyngeal swab, presence of viral mutation(s) within the areas targeted by this assay, and inadequate number of viral copies(<138 copies/mL). A negative result must be combined with clinical observations, patient history, and epidemiological information. The expected result is Negative.  Fact Sheet for Patients:  BloggerCourse.com  Fact Sheet for Healthcare Providers:  SeriousBroker.it  This test is no t yet approved  or cleared by the [place] FDA and  has been authorized for detection and/or diagnosis of SARS-CoV-2 by FDA under an Emergency Use Authorization (EUA). This EUA will remain  in effect (meaning this test can be used) for the duration of the COVID-19 declaration under Section 564(b)(1) of the Act, 21 U.S.C.section 360bbb-3(b)(1), unless the authorization is terminated  or revoked sooner.       Influenza A by PCR NEGATIVE NEGATIVE Final   Influenza B by PCR NEGATIVE NEGATIVE Final    Comment: (NOTE) The Xpert Xpress SARS-CoV-2/FLU/RSV plus assay is intended as an aid in the diagnosis of influenza from Nasopharyngeal swab specimens and should not be used as a sole basis for treatment. Nasal washings and aspirates are unacceptable for Xpert Xpress SARS-CoV-2/FLU/RSV testing.  Fact Sheet for Patients: BloggerCourse.com  Fact Sheet for Healthcare Providers: SeriousBroker.it  This test is not yet approved or cleared by the [place] FDA and has been authorized for detection and/or diagnosis of SARS-CoV-2 by FDA under an Emergency Use Authorization (EUA). This EUA will remain in effect (meaning this test can be used) for the duration of the COVID-19 declaration under Section 564(b)(1) of the Act, 21 U.S.C. section 360bbb-3(b)(1), unless the authorization is terminated or revoked.  Performed at [facility], [address] [place], [place] [address]      Labs:  CBC: Recent Labs  Lab 12/09/20 0157 12/10/20 0258 12/11/20 0212 12/12/20 0244 12/13/20 0204 12/14/20 0300  WBC 55.6* 43.3* 26.4* 15.8* 10.6* 10.4  NEUTROABS 53.9* 38.9* 22.4* 12.2* 8.3*  --   HGB 9.8* 9.5* 9.8* 10.3* 9.6* 9.6*  HCT 27.9* 26.6* 27.7* 29.5* 27.8* 27.8*  MCV 81.6 78.7* 78.9* 79.7* 80.1 79.9*  PLT 452* 434* 480* 487* 440* 456*   BMP &GFR Recent Labs  Lab 12/09/20 0157 12/10/20 0258 12/11/20 0212 12/12/20 0244 12/13/20 0204  12/14/20 0300  NA 124* 131* 135 138 137 142  K 4.1 3.3* 3.5 3.8 3.2* 4.0  CL 93* 101 104 108 107 111  CO2 20* 20* 21* 20* 23 24  GLUCOSE 155* 164* 117* 149* 118* 105*  BUN 49* 44* 33* 26* 17 13  CREATININE 2.35* 1.50* 0.90 0.84 0.60 0.60  CALCIUM 8.1* 8.1* 8.4* 8.8* 8.4* 8.5*  MG 1.6*  --  1.7 1.8  1.6* 1.6*  PHOS  --   --  3.0  --  2.4* 2.6   Estimated Creatinine Clearance: 57.2 mL/min (by C-G formula based on SCr of 0.6 mg/dL). Liver & Pancreas: Recent Labs  Lab 12/08/20 1702 12/09/20 0157 12/14/20 0300  AST 34 35  --   ALT 23 23  --   ALKPHOS 102 112  --   BILITOT 0.9 0.7  --   PROT 6.1* 5.9*  --   ALBUMIN 1.6* 1.5* 1.6*   No results for input(s): LIPASE, AMYLASE in the last 168 hours. No results for input(s): AMMONIA in the last 168 hours. Diabetic: No results for input(s): HGBA1C in the last 72 hours. Recent Labs  Lab 12/10/20 2027 12/10/20 2344 12/11/20 0405 12/11/20 1534 12/11/20 2345  GLUCAP 119* 115* 122* 105* 177*   Cardiac Enzymes: No results for input(s): CKTOTAL, CKMB, CKMBINDEX, TROPONINI in the last 168 hours. No results for input(s): PROBNP in the last 8760 hours. Coagulation Profile: Recent Labs  Lab 12/08/20 1702  INR 1.4*   Thyroid Function Tests: No results for input(s): TSH, T4TOTAL, FREET4, T3FREE, THYROIDAB in the last 72 hours. Lipid Profile: No results for input(s): CHOL, HDL, LDLCALC, TRIG, CHOLHDL, LDLDIRECT in the last 72 hours. Anemia Panel: No results for input(s): VITAMINB12, FOLATE, FERRITIN, TIBC, IRON, RETICCTPCT in the last 72 hours. Urine analysis:    Component Value Date/Time   COLORURINE STRAW (A) 12/08/2020 1702   APPEARANCEUR TURBID (A) 12/08/2020 1702   LABSPEC RESULTS UNAVAILABLE DUE TO INTERFERING SUBSTANCE 12/08/2020 1702   PHURINE RESULTS UNAVAILABLE DUE TO INTERFERING SUBSTANCE 12/08/2020 1702   GLUCOSEU RESULTS UNAVAILABLE DUE TO INTERFERING SUBSTANCE (A) 12/08/2020 1702   HGBUR RESULTS UNAVAILABLE DUE TO  INTERFERING SUBSTANCE (A) 12/08/2020 1702   BILIRUBINUR RESULTS UNAVAILABLE DUE TO INTERFERING SUBSTANCE (A) 12/08/2020 1702   KETONESUR RESULTS UNAVAILABLE DUE TO INTERFERING SUBSTANCE (A) 12/08/2020 1702   PROTEINUR RESULTS UNAVAILABLE DUE TO INTERFERING SUBSTANCE (A) 12/08/2020 1702   NITRITE RESULTS UNAVAILABLE DUE TO INTERFERING SUBSTANCE (A) 12/08/2020 1702   LEUKOCYTESUR RESULTS UNAVAILABLE DUE TO INTERFERING SUBSTANCE (A) 12/08/2020 1702   Sepsis Labs: Invalid input(s): PROCALCITONIN, LACTICIDVEN   Time coordinating discharge: 55 minutes  SIGNED:  Almon Hercules, MD  [facility] 12/15/2020, 11:04 AM

## 2020-12-15 NOTE — Progress Notes (Signed)
[facility] here for transport back to [facility].  Pt dry and clean. Chronic foley intact and emptied for 900 cc clear urine. Pt denies pain. AVS sent with transport.

## 2020-12-15 NOTE — TOC Transition Note (Signed)
Transition of Care [facility]) - CM/SW Discharge Note   Patient Details  Name: Casey Edwards MRN: [id number] Date of Birth: [birth year]  Transition of Care [facility]) CM/SW Contact:  Jimmy Picket, LCSW Phone Number: 12/15/2020, 10:32 AM   Clinical Narrative:     Patient will DC to: [facility] Anticipated DC date: 12/15/20 Family notified: daughter Transport by: Sharin Mons     Per MD patient ready for DC to [facility]. RN, patient, patient's family, and facility notified of DC. Discharge Summary and FL2 sent to facility. DC packet on chart. Insurance Berkley Harvey has been received and pt is covid negative. Ambulance transport requested for patient.    RN to call report to [phone].  CSW will sign off for now as social work intervention is no longer needed. Please consult [place] again if new needs arise.   Final next level of care: Long Term Nursing Home Barriers to Discharge: No Barriers Identified   Patient Goals and CMS Choice Patient states their goals for this hospitalization and ongoing recovery are:: return to LTC      Discharge Placement              Patient chooses bed at: [facility] Patient to be transferred to facility by: Hubert Azure Name of family member notified: Brother, [name] Patient and family notified of of transfer: 12/14/20  Discharge Plan and Services                                     Social Determinants of Health (SDOH) Interventions     Readmission Risk Interventions No flowsheet data found.  Jimmy Picket, LCSW Clinical Social Worker

## 2020-12-15 NOTE — Progress Notes (Signed)
Pt for transfer via [facility] today to [facility]. Left heel unstageable pressure injury with black eschar, was painted with betadine and covered with new foam. Foam for right heel applied also.

## 2020-12-15 NOTE — Progress Notes (Signed)
Attempted to call report again to [facility] but phone just rang and rang and then hung up.

## 2020-12-15 NOTE — Progress Notes (Signed)
Attempted to give report to # listed but no answer after transferred.

## 2020-12-15 NOTE — Progress Notes (Signed)
Phenytoin Follow-Up Consult Indication: Hx Seizures  No Known Allergies  Patient Measurements: Weight: 61.8 kg (136 lb 3.9 oz)   Body mass index is 24.13 kg/m.   Vital signs: Temp: 97.8 F (36.6 C) (10/14 0753) Temp Source: Oral (10/14 0302) BP: 91/68 (10/14 0753) Pulse Rate: 88 (10/14 0753)  Labs: Lab Results  Component Value Date/Time   Phenytoin Lvl 13.3 12/15/2020 0615   Lab Results  Component Value Date   PHENYTOIN 13.3 12/15/2020   Estimated Creatinine Clearance: 57.2 mL/min (by C-G formula based on SCr of 0.6 mg/dL).     Assessment: 65 yof on phenytoin 100mg  ER TID prior to admission. Corrected 10 hour level on 10/7 (on admission) was 11.25 (Goal 10-20). On 10/10 patient was switched to suspension given inability to swallow whole capsules. Suspension absorption may differ from ER capsules.   10/14 repeat phenytoin level (note - phenytoin not at true steady state until day 10-14) Corrected phenytoin level: 31.7 Seizure activity: none Significant potential drug interactions: none  Goals of care:  Total phenytoin level: 10-20 mcg/ml Free phenytoin level: 1-2 mcg/ml  Plan:  Reduce phenytoin suspension to 125mg  PO BID per discussion with MD (discharge medications updated) F/u phenytoin level at new steady state Monitor SCr, LFTs, albumin   11/14, PharmD, BCPS Please check AMION for all [facility] contact numbers Clinical Pharmacist 12/15/2020 11:01 AM

## 2020-12-17 ENCOUNTER — Emergency Department (HOSPITAL_COMMUNITY)
Admission: EM | Admit: 2020-12-17 | Discharge: 2021-01-02 | Disposition: E | Payer: Medicare Other | Attending: Emergency Medicine | Admitting: Emergency Medicine

## 2020-12-17 ENCOUNTER — Other Ambulatory Visit: Payer: Self-pay

## 2020-12-17 ENCOUNTER — Encounter (HOSPITAL_COMMUNITY): Payer: Self-pay

## 2020-12-17 DIAGNOSIS — I469 Cardiac arrest, cause unspecified: Secondary | ICD-10-CM

## 2020-12-17 DIAGNOSIS — R4182 Altered mental status, unspecified: Secondary | ICD-10-CM | POA: Insufficient documentation

## 2020-12-17 DIAGNOSIS — R7309 Other abnormal glucose: Secondary | ICD-10-CM | POA: Insufficient documentation

## 2020-12-17 DIAGNOSIS — F0390 Unspecified dementia without behavioral disturbance: Secondary | ICD-10-CM | POA: Diagnosis not present

## 2020-12-17 DIAGNOSIS — Z7902 Long term (current) use of antithrombotics/antiplatelets: Secondary | ICD-10-CM | POA: Diagnosis not present

## 2020-12-17 DIAGNOSIS — I10 Essential (primary) hypertension: Secondary | ICD-10-CM | POA: Insufficient documentation

## 2020-12-17 LAB — CBG MONITORING, ED: Glucose-Capillary: 198 mg/dL — ABNORMAL HIGH (ref 70–99)

## 2021-01-02 NOTE — ED Notes (Signed)
Bed placement aware post mortem checklist is complete.

## 2021-01-02 NOTE — ED Notes (Signed)
This nurse spoke with [facility], he states he will not be making the trip to pay his respects here at the ER.

## 2021-01-02 NOTE — ED Notes (Signed)
Daughter Karin Golden notified of pt TOD today.  Daughter asked questions and this nurse gave answers.  Attempted to call [facility] with out answer.   Family requesting Jorene Minors [facility] in [place] [phone]

## 2021-01-02 NOTE — ED Notes (Signed)
Per Dr. Freida Busman, the [facility] [place] will sign the death certificate.

## 2021-01-02 NOTE — ED Notes (Signed)
TOD with Dr. Freida Busman at bedside 1443.

## 2021-01-02 NOTE — ED Notes (Signed)
Wooded beaded bractlet and a red pillow in the body bag with the patient.

## 2021-01-02 NOTE — ED Triage Notes (Signed)
Pt arrived via EMS, BS 281, unresponsive, discharge from UTI recently, pt upon arrival is BS 198.   Pt normally responsive.  DNR on chart.

## 2021-01-02 NOTE — ED Provider Notes (Addendum)
[facility]-EMERGENCY DEPT Provider Note   CSN: [id number] Arrival date & time: 01/13/21  1412     History No chief complaint on file.   Casey Edwards is a 65 y.o. female.  65 year old female with history of recent septic shock, dementia presents from facility with altered mental status.  Upon arrival in the ED, patient's blood sugar was 198.  Patient was found to have a blood pressure 94/83.  Pulse ox 90%.  Temperature was 100.9.  According to nursing, patient's only went unresponsive which is when I was called to the room immediately.  When I arrived there, patient was in asystole and apneic.  Patient has a valid DNR present.      Past Medical History:  Diagnosis Date   Hypercholesteremia    Hypertension    Seizures (HCC)    Stroke [facility])    Vitamin D deficiency     Patient Active Problem List   Diagnosis Date Noted   Septic shock (HCC) 12/08/2020   Hyponatremia    Pyelonephritis    Sepsis with acute renal failure and septic shock (HCC)    Anemia of chronic disease 08/25/2020   Aphasia as late effect of cerebrovascular accident 08/25/2020   Dementia (HCC) 08/25/2020   Right hemiparesis (HCC) 08/25/2020   Encephalopathy 08/25/2020   Seizure disorder (HCC) 08/25/2020   CVA (cerebral vascular accident) (HCC) 08/25/2020   Essential hypertension 07/22/2018   History of stroke 07/22/2018   Major depressive disorder 07/22/2018   Other hyperlipidemia 07/22/2018    History reviewed. No pertinent surgical history.   OB History   No obstetric history on file.     No family history on file.  Social History   Tobacco Use   Smoking status: Never   Smokeless tobacco: Never  Substance Use Topics   Alcohol use: Never   Drug use: Never    Home Medications Prior to Admission medications   Medication Sig Start Date End Date Taking? Authorizing Provider  acetaminophen (TYLENOL) 500 MG tablet Take 1,000 mg by mouth daily.    [provider]  baclofen (LIORESAL) 10 MG tablet Take 0.5 tablets (5 mg total) by mouth 2 (two) times daily as needed for muscle spasms. 08/30/20   Merrilyn Puma, MD  cefdinir (OMNICEF) 250 MG/5ML suspension Take 6 mLs (300 mg total) by mouth 2 (two) times daily for 7 days. 12/14/20 12/21/20  Almon Hercules, MD  Cholecalciferol (VITAMIN D3) 50 MCG (2000 UT) TABS Take 2,000 Units by mouth daily.    [provider]  clopidogrel (PLAVIX) 75 MG tablet Take 75 mg by mouth daily.    [provider]  diclofenac Sodium (VOLTAREN) 1 % GEL Apply 4 g topically 3 (three) times daily. Apply topically to bilateral knees. 08/30/20   Merrilyn Puma, MD  Ensure Plus (ENSURE PLUS) LIQD Take 237 mLs by mouth 2 (two) times daily. Strawberry flavor    [provider]  ferrous sulfate 325 (65 FE) MG tablet Take 325 mg by mouth daily.    [provider]  lacosamide (VIMPAT) 200 MG TABS tablet Take 1 tablet (200 mg total) by mouth 2 (two) times daily. 08/30/20   Merrilyn Puma, MD  levETIRAcetam (KEPPRA) 750 MG tablet Take 2 tablets (1,500 mg total) by mouth 2 (two) times daily. 08/30/20   Merrilyn Puma, MD  magnesium hydroxide (MILK OF MAGNESIA) 400 MG/5ML suspension Take 30 mLs by mouth daily as needed for mild constipation.    [provider]  midodrine (PROAMATINE) 5 MG tablet Take 1 tablet (5 mg total) by mouth 3 (three) times daily with meals. 12/14/20   Almon Hercules, MD  Multiple Vitamins-Minerals (MULTIVITAMIN WITH MINERALS) tablet Take 1 tablet by mouth daily.    [provider]  Omega-3 Fatty Acids (FISH OIL) 1000 MG CAPS Take 2,000 mg by mouth 2 (two) times daily.    [provider]  pantoprazole sodium (PROTONIX) 40 mg Take 40 mg by mouth daily. 12/14/20   Almon Hercules, MD  phenytoin (DILANTIN) 125 MG/5ML suspension Take 5 mLs (125 mg total) by mouth 2 (two) times daily. 12/15/20   Almon Hercules, MD  rosuvastatin (CRESTOR) 20 MG tablet Take 1 tablet  (20 mg total) by mouth at bedtime. 08/30/20   Merrilyn Puma, MD  sertraline (ZOLOFT) 100 MG tablet Take 50 mg by mouth daily.    [provider]  tamsulosin (FLOMAX) 0.4 MG CAPS capsule Take 0.4 mg by mouth daily.    [provider]    Allergies    Patient has no known allergies.  Review of Systems   Review of Systems  Unable to perform ROS: Acuity of condition   Physical Exam Updated Vital Signs BP (!) 47/34   Pulse 90   Temp (!) 100.9 F (38.3 C) (Rectal)   Resp (!) 24   Ht 1.6 m (5\' 3" )   Wt 62 kg   SpO2 98%   BMI 24.21 kg/m   Physical Exam Vitals and nursing note reviewed.  Constitutional:      Appearance: She is cachectic.  HENT:     Head: Normocephalic and atraumatic.  Eyes:     General: Lids are normal.     Conjunctiva/sclera: Conjunctivae normal.     Pupils: Pupils are equal, round, and reactive to light.  Neck:     Thyroid: No thyroid mass.     Trachea: No tracheal deviation.  Cardiovascular:     Heart sounds: No murmur heard.   No gallop.  Pulmonary:     Effort: No respiratory distress.     Breath sounds: No stridor. No decreased breath sounds, wheezing, rhonchi or rales.  Abdominal:     General: There is no distension.     Palpations: Abdomen is soft.     Tenderness: There is no abdominal tenderness. There is no rebound.  Musculoskeletal:        General: No tenderness. Normal range of motion.     Cervical back: Normal range of motion and neck supple.  Skin:    General: Skin is warm and dry.     Findings: No abrasion or rash.  Neurological:     Mental Status: She is unresponsive.     GCS: GCS eye subscore is 1. GCS verbal subscore is 1. GCS motor subscore is 1.  Psychiatric:        Attention and Perception: She is inattentive.    ED Results / Procedures / Treatments   Labs (all labs ordered are listed, but only abnormal results are displayed) Labs Reviewed  CBG MONITORING, ED - Abnormal; Notable for the following components:       Result Value   Glucose-Capillary 198 (*)    All other components within normal limits    EKG None  Radiology No results found.  Procedures Procedures   Medications Ordered in ED Medications - No data to display  ED Course  I have reviewed the triage vital signs and the nursing notes.  Pertinent labs &  imaging results that were available during my care of the patient were reviewed by me and considered in my medical decision making (see chart for details).    MDM Rules/Calculators/A&P                           Upon initial arrival, patient was in asystole.  She had no heart tones on auscultation.  She has a valid DNR.  Patient pronounced deceased at 2:43 PM.  Will notify family D[facility] with [facility] [place] and they will sign the [facility] clinic will sign death certificate Final Clinical Impression(s) / ED Diagnoses Final diagnoses:  None    Rx / DC Orders ED Discharge Orders     None        Lorre Nick, MD Jan 10, 2021 1453    Lorre Nick, MD January 10, 2021 1607

## 2021-01-02 NOTE — ED Notes (Signed)
[facility] Notified spoke with Myrle Sheng reference # [phone]

## 2021-01-02 DEATH — deceased

## 2022-08-03 IMAGING — CT CT ABD-PELV W/O CM
2 of 4 series · 16 of 46 positions shown, 18 images · non-contrast
Comparison: None

CLINICAL DATA: Uncomplicated UTI, had a Foley catheter a few weeks
ago with that was removed but had another Foley inserted due to
urinary retention. Cloudy urine. History stroke. Altered mental
status. History hypertension

EXAM:
CT ABDOMEN AND PELVIS WITHOUT CONTRAST
TECHNIQUE: Multidetector CT imaging of the abdomen and pelvis was performed
following the standard protocol without IV contrast. Sagittal and
coronal MPR images reconstructed from axial data set. No oral
contrast administered. Beam hardening artifacts in upper abdomen
from patient's arms.

[Series 3: a/p w/o 5mm · axial · non-contrast · 0.88mm/px · z∈[-916,-466]mm · 13 of 98 slices shown, 15 images]
[im 4/98  soft-tissue]
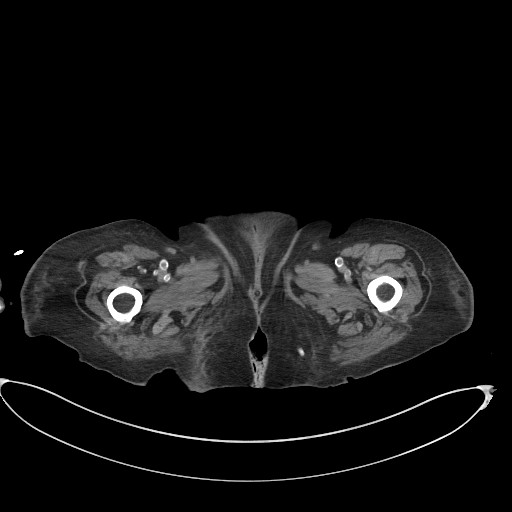
[im 4/98  bone]
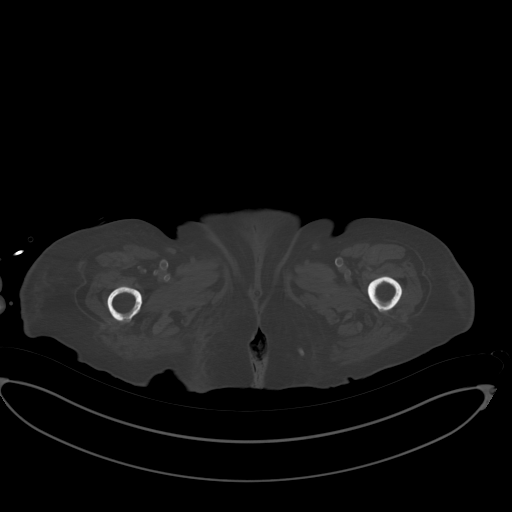
[im 12/98  soft-tissue]
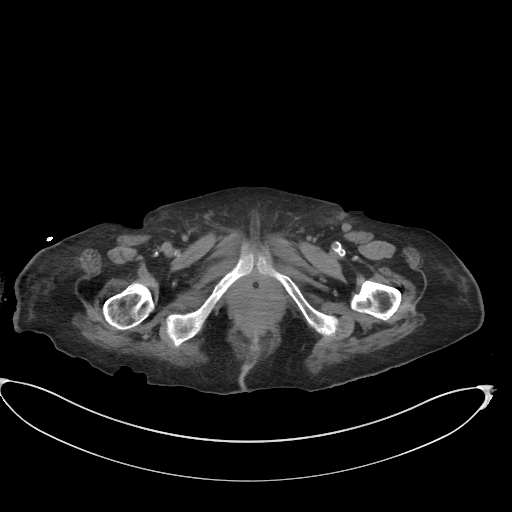
[im 20/98  soft-tissue]
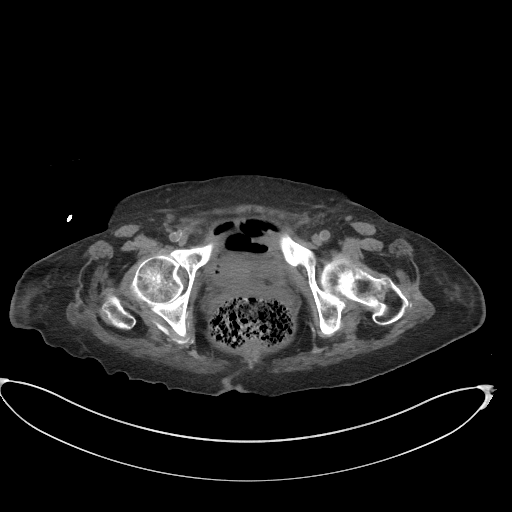
[im 28/98  soft-tissue]
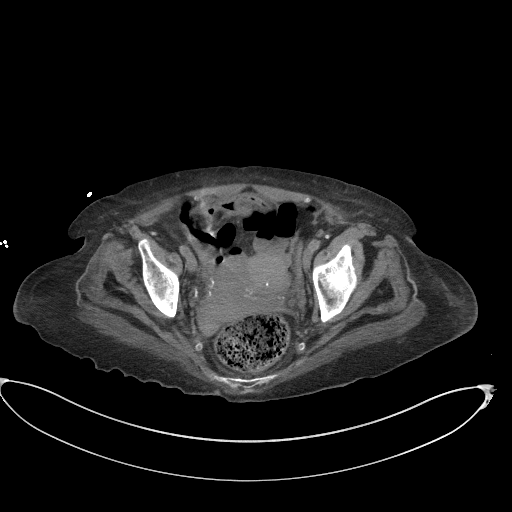
[im 35/98  soft-tissue]
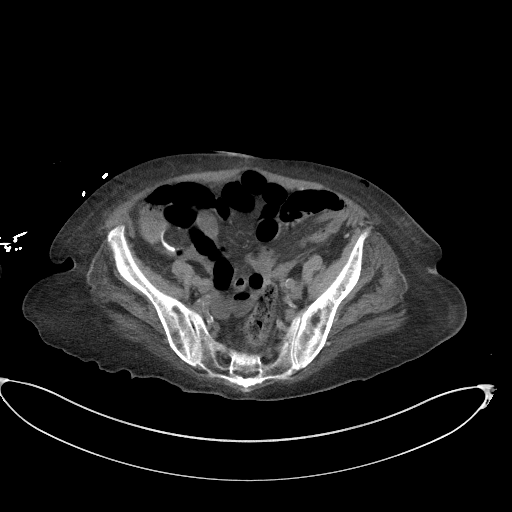
[im 43/98  soft-tissue]
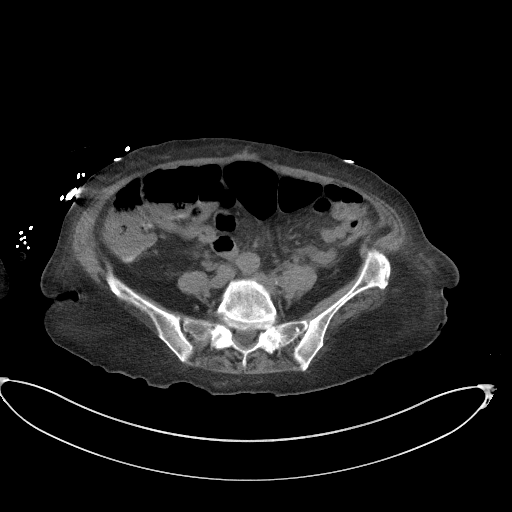
[im 51/98  soft-tissue]
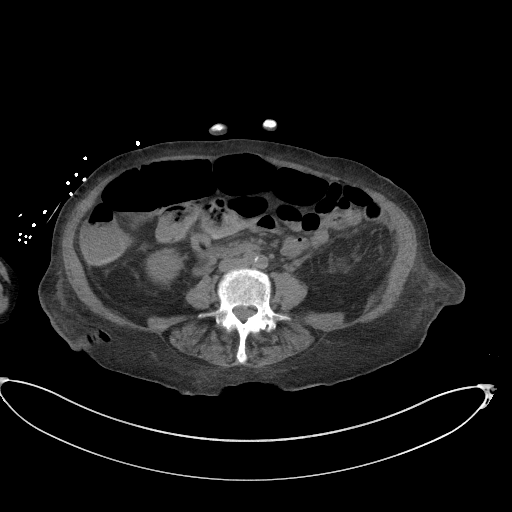
[im 55/98  soft-tissue]
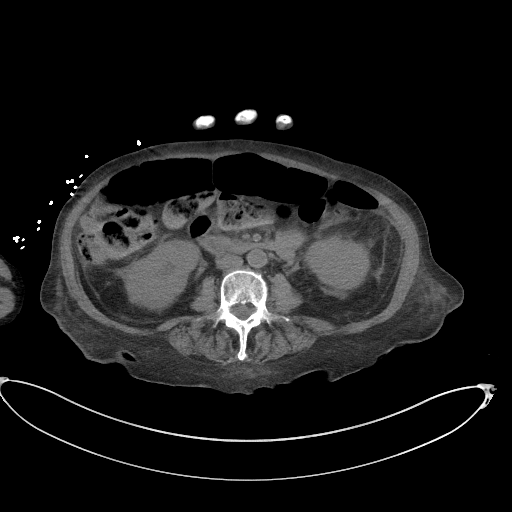
[im 63/98  soft-tissue]
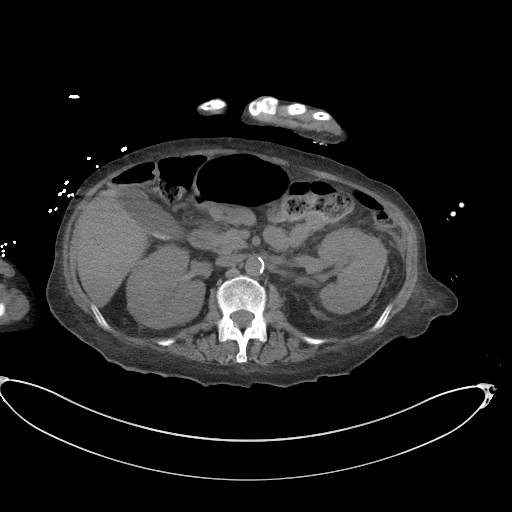
[im 63/98  bone]
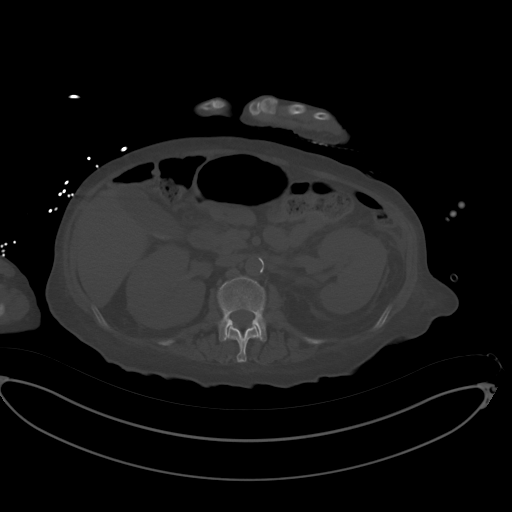
[im 70/98  soft-tissue]
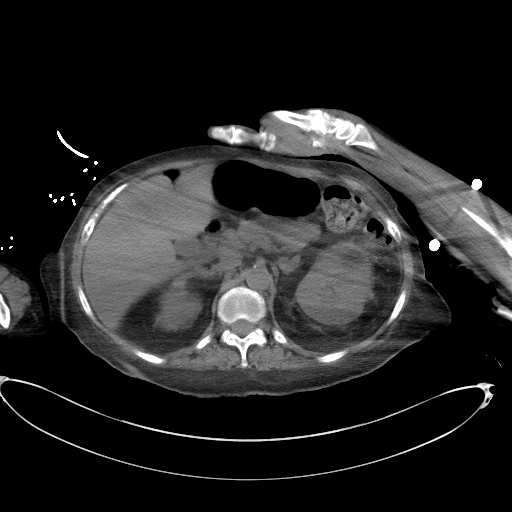
[im 78/98  soft-tissue]
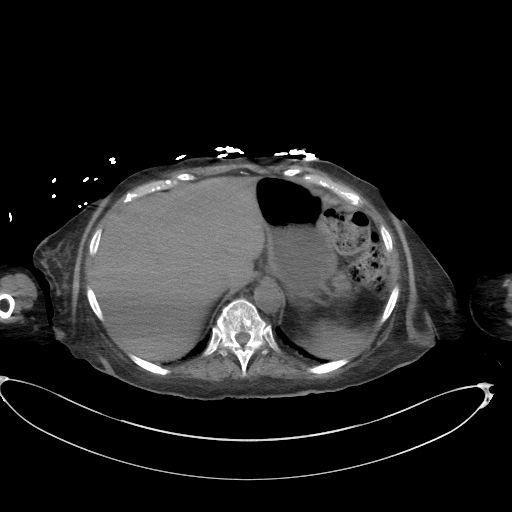
[im 86/98  soft-tissue]
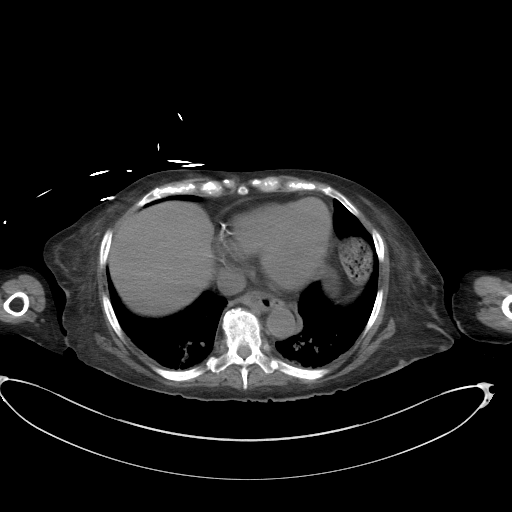
[im 94/98  soft-tissue]
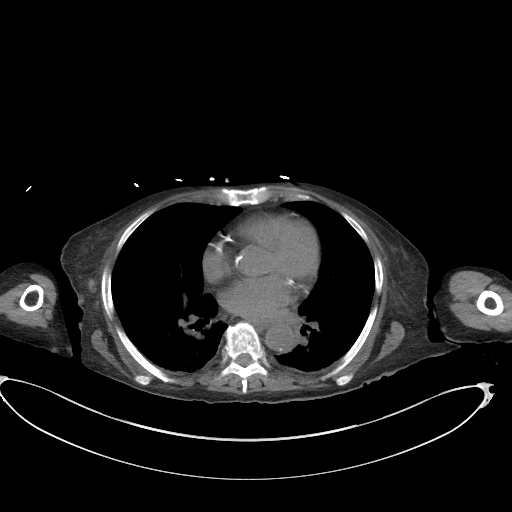

[Series 6: a/p w/o cor · coronal · non-contrast · 0.84mm/px · 3 of 122 slices shown]
[im 41/122  soft-tissue]
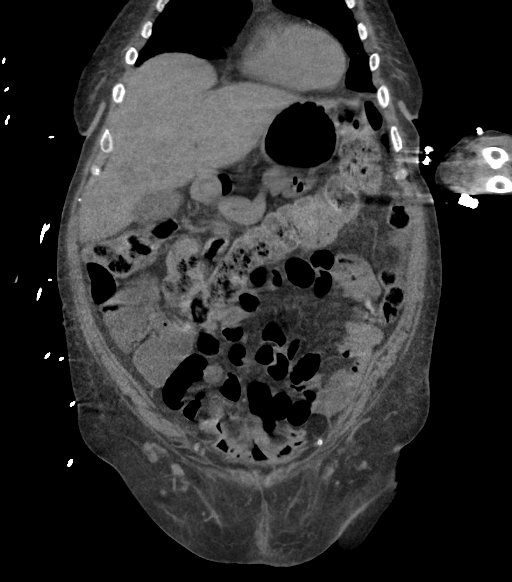
[im 54/122  soft-tissue]
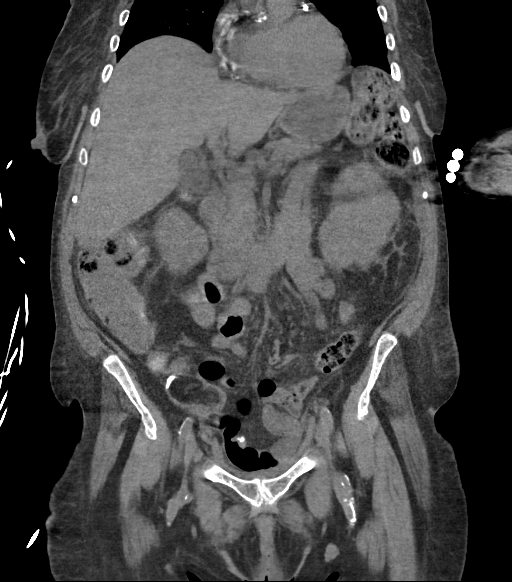
[im 68/122  soft-tissue]
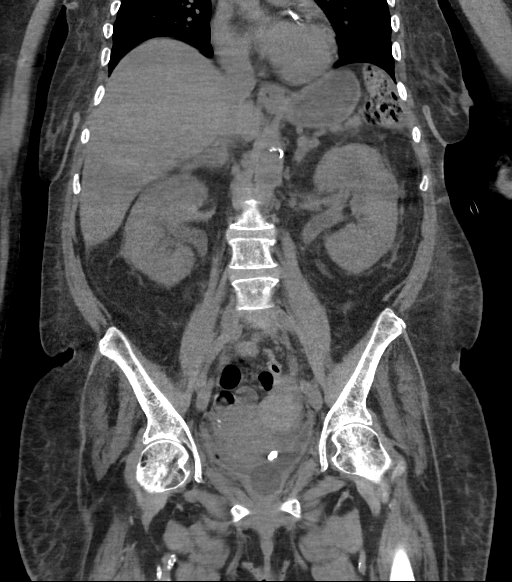

[16 of 46 positions shown; findings below may reference images not displayed]

FINDINGS: Lower chest: Subsegmental atelectasis at both lung bases. Additional
questionable area of infiltrate in LEFT lower lobe.

Hepatobiliary: Dependent calculi within gallbladder. Liver
unremarkable.

Pancreas: Normal appearance

Spleen: Normal appearance

Adrenals/Urinary Tract: Thickening of BILATERAL adrenal glands
without discrete mass. Both kidneys demonstrate cortical thickening
and perinephric edema, without discrete mass. Urinary bladder
decompressed by Foley catheter. Air within bladder. Calcification
adjacent to the catheter balloon consistent with calculus, 8 x 10 x
7 mm.

Stomach/Bowel: Probable normal appendix coiled adjacent to cecal
tip. Increased stool in rectum. Thickening of distal esophagus.
Stomach and remaining bowel loops unremarkable. No evidence of bowel
obstruction or wall thickening.

Vascular/Lymphatic: Atherosclerotic calcifications aorta, iliac
arteries, femoral arteries, coronary arteries. Aorta normal caliber.
No adenopathy.

Reproductive: Soft tissue mass exophytic from LEFT upper uterus
suspected uterine leiomyoma 3.8 x 3.2 x 3.1 cm. Unremarkable LEFT
ovary. Mass within RIGHT ovary 5.4 x 3.4 x 3.9 cm containing
calcification and fat consistent with dermoid tumor.

Other: Small amount of nonspecific free pelvic fluid. No free air.
No hernia.

Musculoskeletal: Superior endplate compression deformity of L5
vertebral body without surrounding infiltration to suggest acute.
Osseous demineralization.
IMPRESSION: RIGHT ovarian dermoid tumor 5.4 x 3.4 x 3.9 cm.

Cholelithiasis.

10 mm bladder calculus.

Kidneys demonstrate cortical thickening with perinephric edema
bilaterally, nonspecific but could represent pyelonephritis;
recommend correlation with urinalysis.

Bibasilar atelectasis with question small focus of infiltrate in
LEFT lower lobe.

Question uterine fibroid 3.8 cm diameter.

Thickening of distal esophagus question esophagitis; recommend
correlation with patient symptoms and consider non emergent
follow-up esophagram or endoscopy assessment.

Aortic Atherosclerosis ([id number]-44I.I).

## 2022-08-03 IMAGING — CT CT HEAD W/O CM
4 series · 14 of 47 positions shown, 16 images · non-contrast
Comparison: MRI 08/24/2020, CT brain 08/24/2020

CLINICAL DATA: Delirium

EXAM:
CT HEAD WITHOUT CONTRAST
TECHNIQUE: Contiguous axial images were obtained from the base of the skull
through the vertex without intravenous contrast.

[Series 3: head without ax · axial · non-contrast · 0.30mm/px · z∈[-276,-170]mm · 7 of 31 slices shown, 9 images]
[im 4/31  brain]
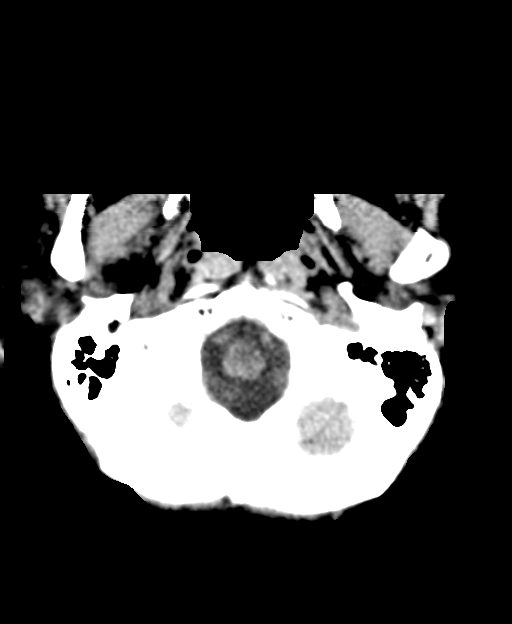
[im 4/31  bone]
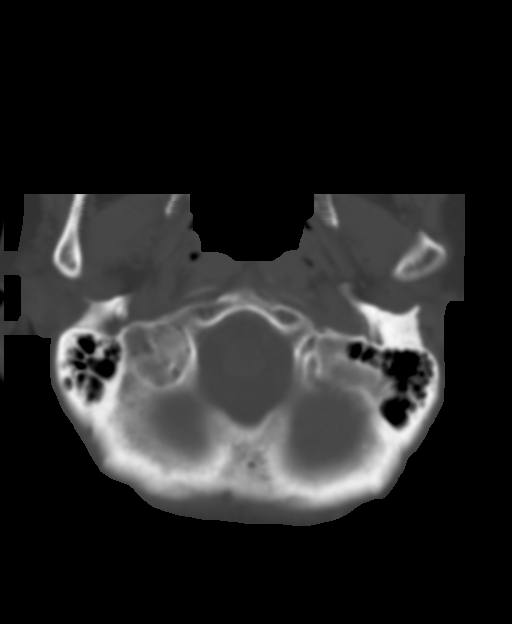
[im 8/31  brain]
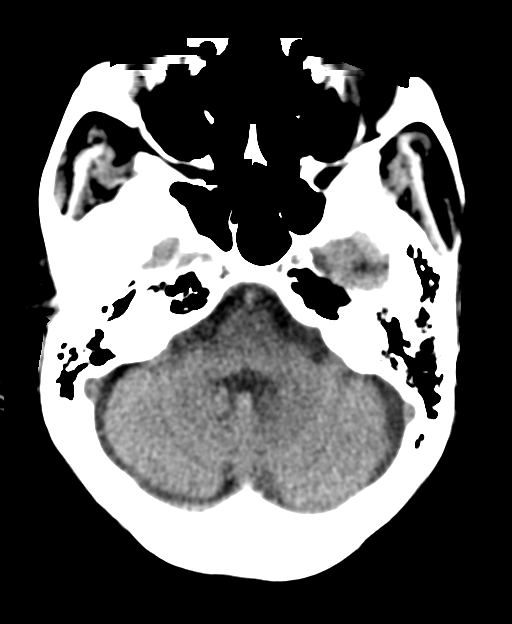
[im 12/31  brain]
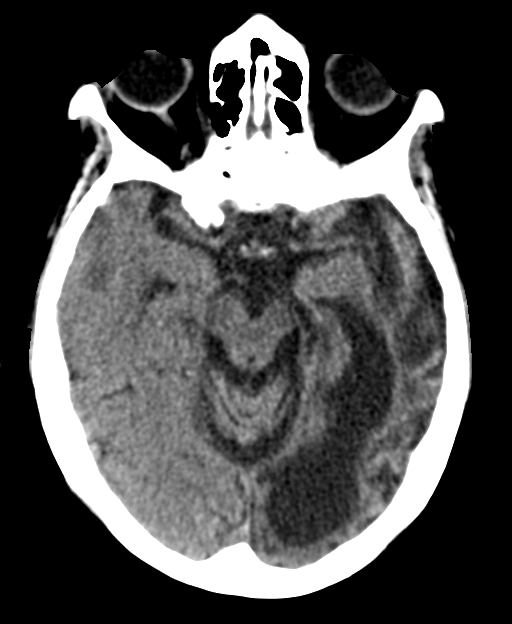
[im 16/31  brain]
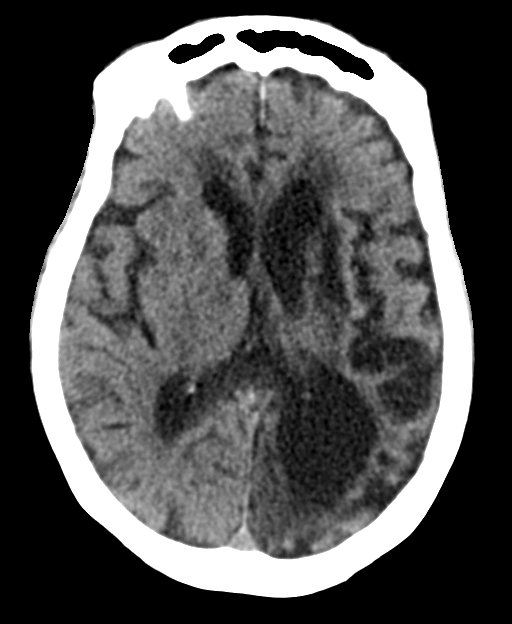
[im 19/31  brain]
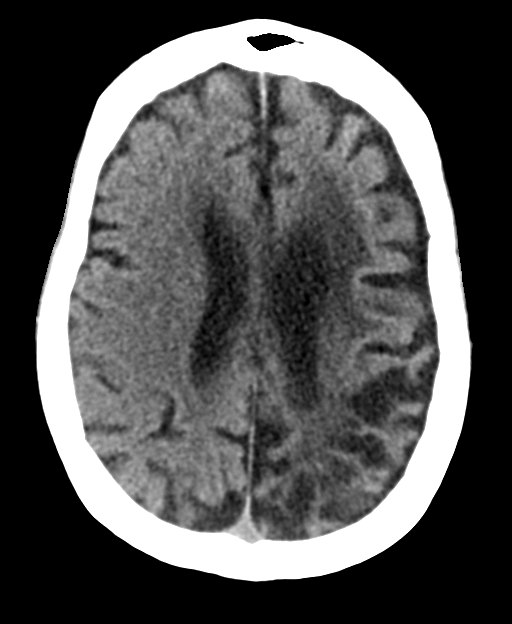
[im 19/31  bone]
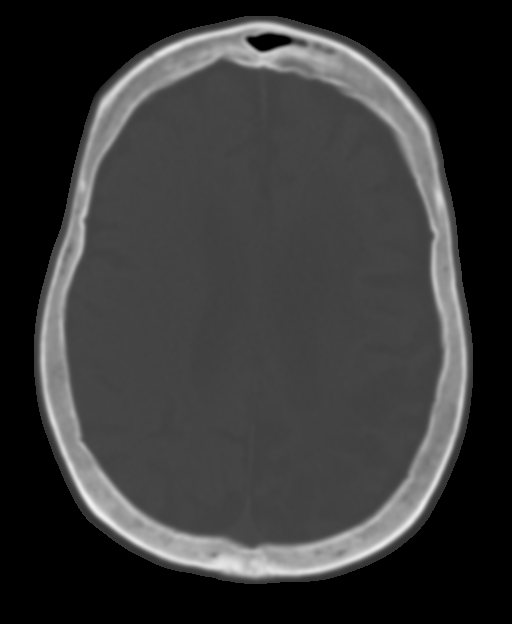
[im 23/31  brain]
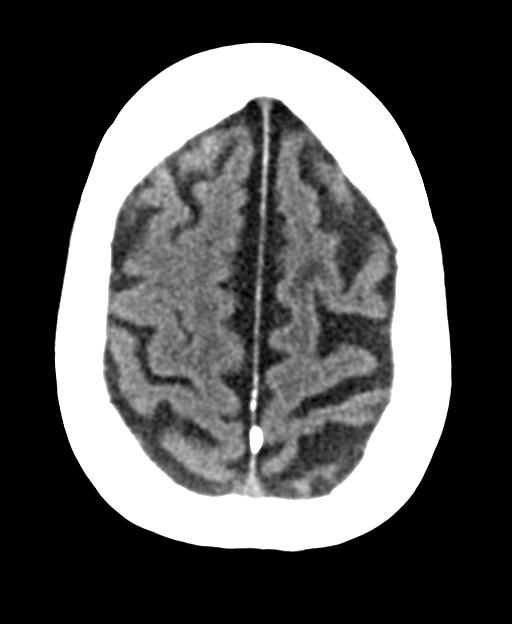
[im 27/31  brain]
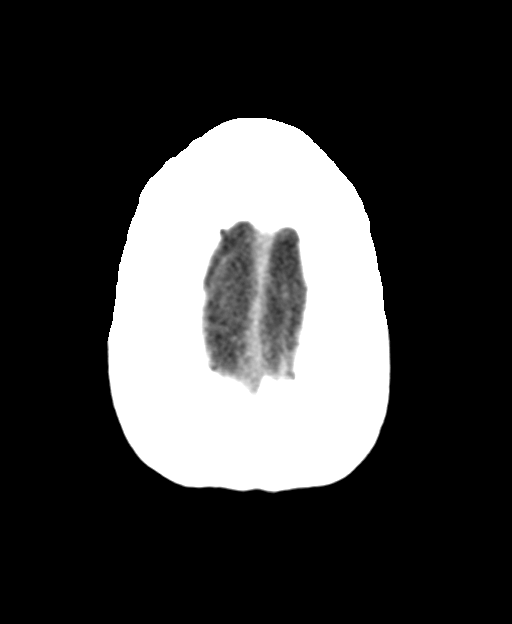

[Series 4: ax head bone · axial · 0.32mm/px · 1 of 78 slices shown]
[im 8/78  bone]
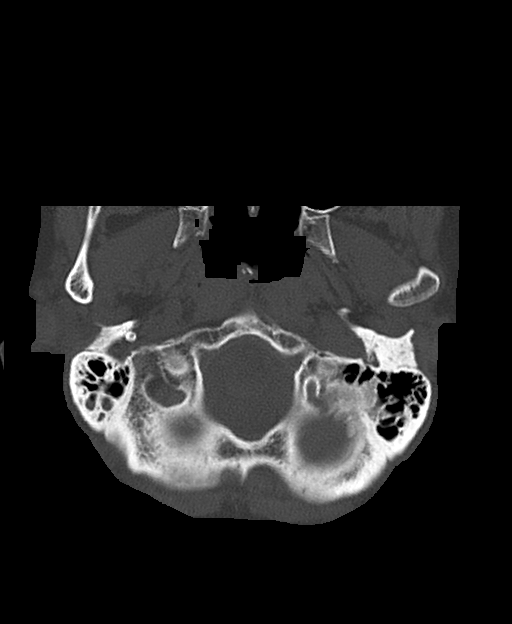

[Series 6: head without cor · coronal · non-contrast · 0.31mm/px · 3 of 67 slices shown]
[im 23/67  brain]
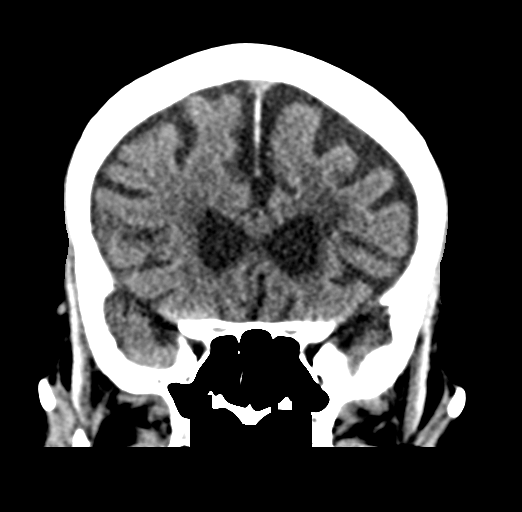
[im 30/67  brain]
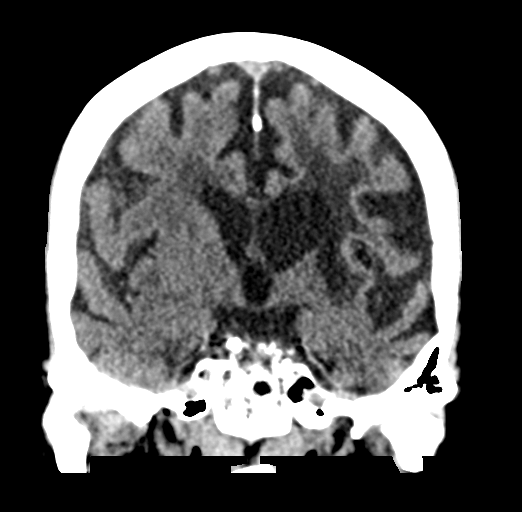
[im 37/67  brain]
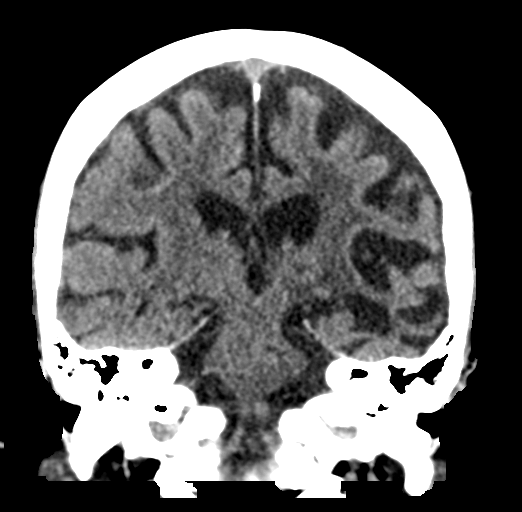

[Series 7: head without sag · sagittal · non-contrast · 0.30mm/px · 3 of 54 slices shown]
[im 18/54  brain]
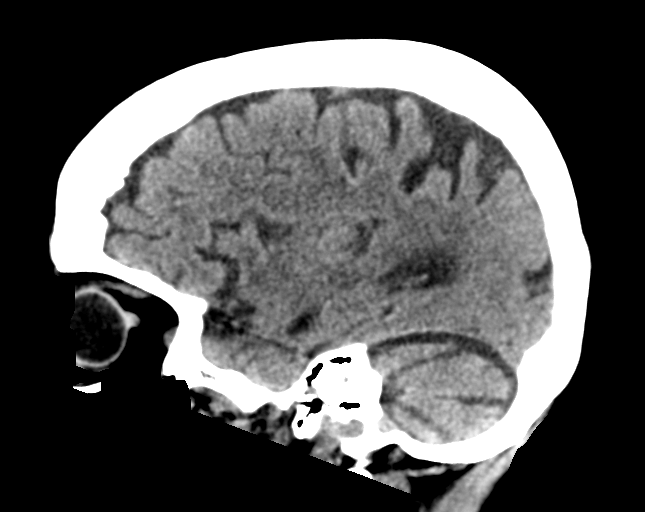
[im 27/54  brain]
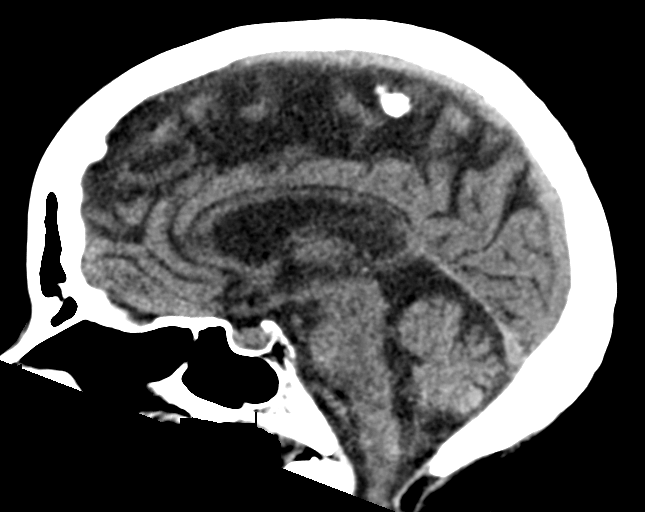
[im 36/54  brain]
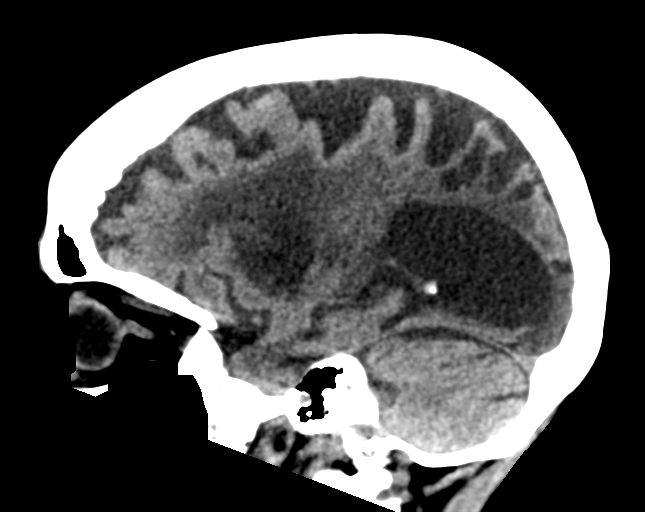

[14 of 47 positions shown; findings below may reference images not displayed]

FINDINGS: Brain: No acute territorial infarction, hemorrhage or intracranial
mass. Extensive chronic small vessel ischemic changes of the white
matter. Advanced atrophy. Chronic white matter infarct on the right.
Encephalomalacia within the left temporal, encephalomalacia within
the left basal ganglia and thalamus. Similar ex vacuo dilatation of
left lateral ventricle. Parietal and occipital lobes.

Vascular: No hyperdense vessels.  Carotid vascular calcification

Skull: Normal. Negative for fracture or focal lesion.

Sinuses/Orbits: No acute finding.

Other: None
IMPRESSION: 1. No definite CT evidence for acute intracranial abnormality.
2. Atrophy and chronic small vessel ischemic changes of the white
matter. Extensive left-sided encephalomalacia as described above.
